# Patient Record
Sex: Male | Born: 1975 | Race: White | Hispanic: Yes | Marital: Married | Smoking: Never smoker
Health system: Southern US, Community
[De-identification: ages and names within clinical notes are randomized; demographics above are authoritative.]

## PROBLEM LIST (undated history)

## (undated) DIAGNOSIS — I82409 Acute embolism and thrombosis of unspecified deep veins of unspecified lower extremity: Secondary | ICD-10-CM

## (undated) DIAGNOSIS — U071 COVID-19: Secondary | ICD-10-CM

## (undated) DIAGNOSIS — I2699 Other pulmonary embolism without acute cor pulmonale: Secondary | ICD-10-CM

---

## 2004-12-29 ENCOUNTER — Ambulatory Visit: Payer: Self-pay | Admitting: *Deleted

## 2008-07-26 ENCOUNTER — Emergency Department (HOSPITAL_COMMUNITY): Admission: EM | Admit: 2008-07-26 | Discharge: 2008-07-26 | Payer: Self-pay | Admitting: Emergency Medicine

## 2010-10-16 LAB — URINALYSIS, ROUTINE W REFLEX MICROSCOPIC
Bilirubin Urine: NEGATIVE
Glucose, UA: NEGATIVE mg/dL
Hgb urine dipstick: NEGATIVE
Ketones, ur: NEGATIVE mg/dL
Nitrite: NEGATIVE
Protein, ur: NEGATIVE mg/dL
Specific Gravity, Urine: 1.015 (ref 1.005–1.030)
Urobilinogen, UA: 0.2 mg/dL (ref 0.0–1.0)
pH: 6.5 (ref 5.0–8.0)

## 2010-10-16 LAB — GC/CHLAMYDIA PROBE AMP, GENITAL
Chlamydia, DNA Probe: NEGATIVE
GC Probe Amp, Genital: NEGATIVE

## 2020-02-03 ENCOUNTER — Inpatient Hospital Stay (HOSPITAL_BASED_OUTPATIENT_CLINIC_OR_DEPARTMENT_OTHER)
Admission: EM | Admit: 2020-02-03 | Discharge: 2020-02-10 | DRG: 177 | Disposition: A | Discharge status: Home / Self Care | Payer: HRSA Program | Attending: Internal Medicine | Admitting: Internal Medicine

## 2020-02-03 ENCOUNTER — Encounter (HOSPITAL_BASED_OUTPATIENT_CLINIC_OR_DEPARTMENT_OTHER): Payer: Self-pay | Admitting: Emergency Medicine

## 2020-02-03 ENCOUNTER — Emergency Department (HOSPITAL_BASED_OUTPATIENT_CLINIC_OR_DEPARTMENT_OTHER): Payer: HRSA Program

## 2020-02-03 ENCOUNTER — Other Ambulatory Visit: Payer: Self-pay

## 2020-02-03 DIAGNOSIS — D649 Anemia, unspecified: Secondary | ICD-10-CM | POA: Diagnosis present

## 2020-02-03 DIAGNOSIS — J1282 Pneumonia due to coronavirus disease 2019: Secondary | ICD-10-CM | POA: Diagnosis present

## 2020-02-03 DIAGNOSIS — T380X5A Adverse effect of glucocorticoids and synthetic analogues, initial encounter: Secondary | ICD-10-CM | POA: Diagnosis present

## 2020-02-03 DIAGNOSIS — U071 COVID-19: Secondary | ICD-10-CM | POA: Diagnosis not present

## 2020-02-03 DIAGNOSIS — R7401 Elevation of levels of liver transaminase levels: Secondary | ICD-10-CM | POA: Diagnosis present

## 2020-02-03 DIAGNOSIS — J9601 Acute respiratory failure with hypoxia: Secondary | ICD-10-CM | POA: Diagnosis present

## 2020-02-03 DIAGNOSIS — R739 Hyperglycemia, unspecified: Secondary | ICD-10-CM | POA: Diagnosis present

## 2020-02-03 DIAGNOSIS — R7303 Prediabetes: Secondary | ICD-10-CM | POA: Diagnosis present

## 2020-02-03 DIAGNOSIS — R197 Diarrhea, unspecified: Secondary | ICD-10-CM | POA: Diagnosis present

## 2020-02-03 LAB — D-DIMER, QUANTITATIVE: D-Dimer, Quant: 0.54 ug/mL-FEU — ABNORMAL HIGH (ref 0.00–0.50)

## 2020-02-03 LAB — SARS CORONAVIRUS 2 BY RT PCR (HOSPITAL ORDER, PERFORMED IN ~~LOC~~ HOSPITAL LAB): SARS Coronavirus 2: POSITIVE — AB

## 2020-02-03 LAB — CBC WITH DIFFERENTIAL/PLATELET
Abs Immature Granulocytes: 0.06 10*3/uL (ref 0.00–0.07)
Basophils Absolute: 0 10*3/uL (ref 0.0–0.1)
Basophils Relative: 0 %
Eosinophils Absolute: 0 10*3/uL (ref 0.0–0.5)
Eosinophils Relative: 0 %
HCT: 38.9 % — ABNORMAL LOW (ref 39.0–52.0)
Hemoglobin: 12.8 g/dL — ABNORMAL LOW (ref 13.0–17.0)
Immature Granulocytes: 1 %
Lymphocytes Relative: 13 %
Lymphs Abs: 1 10*3/uL (ref 0.7–4.0)
MCH: 29.1 pg (ref 26.0–34.0)
MCHC: 32.9 g/dL (ref 30.0–36.0)
MCV: 88.4 fL (ref 80.0–100.0)
Monocytes Absolute: 0.6 10*3/uL (ref 0.1–1.0)
Monocytes Relative: 8 %
Neutro Abs: 5.8 10*3/uL (ref 1.7–7.7)
Neutrophils Relative %: 78 %
Platelets: 253 10*3/uL (ref 150–400)
RBC: 4.4 MIL/uL (ref 4.22–5.81)
RDW: 13.4 % (ref 11.5–15.5)
WBC: 7.5 10*3/uL (ref 4.0–10.5)
nRBC: 0 % (ref 0.0–0.2)

## 2020-02-03 LAB — PROCALCITONIN: Procalcitonin: <0.1 ng/mL

## 2020-02-03 LAB — COMPREHENSIVE METABOLIC PANEL
ALT: 55 U/L — ABNORMAL HIGH (ref 0–44)
AST: 51 U/L — ABNORMAL HIGH (ref 15–41)
Albumin: 3.1 g/dL — ABNORMAL LOW (ref 3.5–5.0)
Alkaline Phosphatase: 188 U/L — ABNORMAL HIGH (ref 38–126)
Anion gap: 12 (ref 5–15)
BUN: 7 mg/dL (ref 6–20)
CO2: 27 mmol/L (ref 22–32)
Calcium: 7.8 mg/dL — ABNORMAL LOW (ref 8.9–10.3)
Chloride: 97 mmol/L — ABNORMAL LOW (ref 98–111)
Creatinine, Ser: 0.83 mg/dL (ref 0.61–1.24)
GFR calc Af Amer: >60 mL/min (ref >60)
GFR calc non Af Amer: >60 mL/min (ref >60)
Glucose, Bld: 158 mg/dL — ABNORMAL HIGH (ref 70–99)
Potassium: 4.4 mmol/L (ref 3.5–5.1)
Sodium: 136 mmol/L (ref 135–145)
Total Bilirubin: 0.4 mg/dL (ref 0.3–1.2)
Total Protein: 7.6 g/dL (ref 6.5–8.1)

## 2020-02-03 LAB — LACTATE DEHYDROGENASE: LDH: 256 U/L — ABNORMAL HIGH (ref 98–192)

## 2020-02-03 LAB — FIBRINOGEN: Fibrinogen: 684 mg/dL — ABNORMAL HIGH (ref 210–475)

## 2020-02-03 LAB — FERRITIN: Ferritin: 530 ng/mL — ABNORMAL HIGH (ref 24–336)

## 2020-02-03 LAB — LACTIC ACID, PLASMA: Lactic Acid, Venous: 1.2 mmol/L (ref 0.5–1.9)

## 2020-02-03 LAB — C-REACTIVE PROTEIN: CRP: 18.8 mg/dL — ABNORMAL HIGH (ref <1.0)

## 2020-02-03 LAB — TRIGLYCERIDES: Triglycerides: 159 mg/dL — ABNORMAL HIGH (ref <150)

## 2020-02-03 MED ORDER — ACETAMINOPHEN 325 MG PO TABS
650.0000 mg | ORAL_TABLET | Freq: Once | ORAL | Status: AC
  Administered 2020-02-03: 650 mg via ORAL
  Filled 2020-02-03: qty 2

## 2020-02-03 MED ORDER — DEXAMETHASONE SODIUM PHOSPHATE 10 MG/ML IJ SOLN
10.0000 mg | Freq: Once | INTRAMUSCULAR | Status: AC
  Administered 2020-02-03: 10 mg via INTRAVENOUS
  Filled 2020-02-03: qty 1

## 2020-02-03 MED ORDER — SODIUM CHLORIDE 0.9 % IV SOLN
100.0000 mg | INTRAVENOUS | Status: AC
  Administered 2020-02-03 (×2): 100 mg via INTRAVENOUS
  Filled 2020-02-03 (×2): qty 20

## 2020-02-03 MED ORDER — ACETAMINOPHEN 500 MG PO TABS
ORAL_TABLET | ORAL | Status: AC
  Filled 2020-02-03: qty 1

## 2020-02-03 MED ORDER — SODIUM CHLORIDE 0.9 % IV SOLN
100.0000 mg | Freq: Every day | INTRAVENOUS | Status: AC
  Administered 2020-02-04 – 2020-02-07 (×4): 100 mg via INTRAVENOUS
  Filled 2020-02-03 (×4): qty 20

## 2020-02-03 NOTE — ED Notes (Signed)
RT assessed upon entering room. Patient was up walking room, RA SAT 84%. Noted some SOB. Placed on 4L [place], SAT now 96%. BBS diminished. Stated he has been sicl for 11 days. Patient place on monitor.

## 2020-02-03 NOTE — ED Triage Notes (Signed)
Sob x 11 days, fever, low sats in triage, possible covid exposure.

## 2020-02-03 NOTE — ED Notes (Signed)
Wife updated @ [phone]

## 2020-02-03 NOTE — ED Provider Notes (Signed)
[facility] EMERGENCY DEPARTMENT Provider Note  CSN: [id number] Arrival date & time: 02/03/20 1103    History Chief Complaint  Patient presents with  . Shortness of Breath    HPI  Victor Rich is a 44 y.o. male with no significant PMH reports he has been feeling poorly the last 11 days. Started with fevers and body aches, progressed to SOB a few days ago, worse today. Some dry cough, headache and diarrhea. Has not had covid vaccine.    No past medical history on file.    No family history on file.  Social History   Tobacco Use  . Smoking status: Never Smoker  . Smokeless tobacco: Never Used  Substance Use Topics  . Alcohol use: Not on file  . Drug use: Not on file     Home Medications Prior to Admission medications   Not on File     Allergies    Patient has no known allergies.   Review of Systems   Review of Systems A comprehensive review of systems was completed and negative except as noted in HPI.    Physical Exam BP 96/69   Pulse 71   Temp 98.7 F (37.1 C) (Oral)   Resp (!) 22   Ht 5\' 6"  (1.676 m)   Wt 79.4 kg   SpO2 97%   BMI 28.25 kg/m   Physical Exam Vitals and nursing note reviewed.  Constitutional:      Appearance: Normal appearance.  HENT:     Head: Normocephalic and atraumatic.     Nose: Nose normal.     Mouth/Throat:     Mouth: Mucous membranes are moist.  Eyes:     Extraocular Movements: Extraocular movements intact.     Conjunctiva/sclera: Conjunctivae normal.  Cardiovascular:     Rate and Rhythm: Normal rate.  Pulmonary:     Effort: Pulmonary effort is normal.     Breath sounds: Normal breath sounds.  Abdominal:     General: Abdomen is flat.     Palpations: Abdomen is soft.     Tenderness: There is no abdominal tenderness.  Musculoskeletal:        General: No swelling. Normal range of motion.     Cervical back: Neck supple.  Skin:    General: Skin is warm and dry.  Neurological:     General: No focal  deficit present.     Mental Status: He is alert.  Psychiatric:        Mood and Affect: Mood normal.      ED Results / Procedures / Treatments   Labs (all labs ordered are listed, but only abnormal results are displayed) Labs Reviewed  SARS CORONAVIRUS 2 BY RT PCR (HOSPITAL ORDER, PERFORMED IN [facility]) - Abnormal; Notable for the following components:      Result Value   SARS Coronavirus 2 POSITIVE (*)    All other components within normal limits  CBC WITH DIFFERENTIAL/PLATELET - Abnormal; Notable for the following components:   Hemoglobin 12.8 (*)    HCT 38.9 (*)    All other components within normal limits  COMPREHENSIVE METABOLIC PANEL - Abnormal; Notable for the following components:   Chloride 97 (*)    Glucose, Bld 158 (*)    Calcium 7.8 (*)    Albumin 3.1 (*)    AST 51 (*)    ALT 55 (*)    Alkaline Phosphatase 188 (*)    All other components within normal limits  D-DIMER,  QUANTITATIVE (NOT AT [facility]) - Abnormal; Notable for the following components:   D-Dimer, Quant 0.54 (*)    All other components within normal limits  CULTURE, BLOOD (ROUTINE X 2)  CULTURE, BLOOD (ROUTINE X 2)  LACTIC ACID, PLASMA  LACTIC ACID, PLASMA  PROCALCITONIN  LACTATE DEHYDROGENASE  FERRITIN  TRIGLYCERIDES  FIBRINOGEN  C-REACTIVE PROTEIN    EKG EKG Interpretation  Date/Time:  Wednesday February 03 2020 12:01:42 EDT Ventricular Rate:  82 PR Interval:    QRS Duration: 86 QT Interval:  373 QTC Calculation: 436 R Axis:   11 Text Interpretation: Sinus rhythm Borderline ST elevation, lateral leads No old tracing to compare Confirmed by Susy Frizzle [phone]) on 02/03/2020 12:32:29 PM    Radiology DG Chest Port 1 View  Result Date: 02/03/2020 CLINICAL DATA:  Cough, shortness of breath, fever EXAM: PORTABLE CHEST 1 VIEW COMPARISON:  None. FINDINGS: The heart size and mediastinal contours are within normal limits. Low lung volumes. Hazy interstitial opacities within the  peripheral aspects of the bilateral lung bases, left slightly worse than right. No appreciable pleural fluid collection. No pneumothorax. The visualized skeletal structures are unremarkable. IMPRESSION: Hazy interstitial opacities within the peripheral aspects of the bilateral lung bases, left slightly worse than right. Findings suspicious for atypical/viral infection. Electronically Signed   By: Duanne Guess D.O.   On: 02/03/2020 12:26    Procedures Procedures  Medications Ordered in the ED Medications  dexamethasone (DECADRON) injection 10 mg (has no administration in time range)  remdesivir 100 mg in sodium chloride 0.9 % 100 mL IVPB (has no administration in time range)  remdesivir 100 mg in sodium chloride 0.9 % 100 mL IVPB (has no administration in time range)     MDM Rules/Calculators/A&P MDM Patient noted to be hypoxic on RA on arrival, improved with 4L . Suspect he has Covid, will initiate Covid order set for likely admission.  ED Course  I have reviewed the triage vital signs and the nursing notes.  Pertinent labs & imaging results that were available during my care of the patient were reviewed by me and considered in my medical decision making (see chart for details).  Clinical Course as of Feb 02 1358  Wed Feb 03, 2020  1232 CXR consistent with suspected covid.    [CS]  1245 CBC and lactic acid are normal.    [CS]  1259 Mild elevated glucose and LFTs on CMP, otherwise unremarkable. Dimer mildly elevated, consist with suspected Covid.   [CS]  1347 Covid confirmed positive. Will discuss with Hospitalist for admission.    [CS]  1353 Spoke with Dr. Osvaldo Shipper, Hospitalist, who will admit. Remdesivir and Decadron initiated. Patient aware of plan.    [CS]    Clinical Course User Index [CS] Pollyann Savoy, MD    Final Clinical Impression(s) / ED Diagnoses Final diagnoses:  None    Rx / DC Orders ED Discharge Orders    None       Pollyann Savoy,  MD 02/03/20 1359

## 2020-02-04 ENCOUNTER — Inpatient Hospital Stay (HOSPITAL_COMMUNITY): Payer: HRSA Program

## 2020-02-04 DIAGNOSIS — R9431 Abnormal electrocardiogram [ECG] [EKG]: Secondary | ICD-10-CM

## 2020-02-04 DIAGNOSIS — R7401 Elevation of levels of liver transaminase levels: Secondary | ICD-10-CM | POA: Diagnosis not present

## 2020-02-04 DIAGNOSIS — T380X5A Adverse effect of glucocorticoids and synthetic analogues, initial encounter: Secondary | ICD-10-CM | POA: Diagnosis present

## 2020-02-04 DIAGNOSIS — R739 Hyperglycemia, unspecified: Secondary | ICD-10-CM | POA: Diagnosis present

## 2020-02-04 DIAGNOSIS — R197 Diarrhea, unspecified: Secondary | ICD-10-CM | POA: Diagnosis present

## 2020-02-04 DIAGNOSIS — D649 Anemia, unspecified: Secondary | ICD-10-CM | POA: Diagnosis present

## 2020-02-04 DIAGNOSIS — R7303 Prediabetes: Secondary | ICD-10-CM | POA: Diagnosis present

## 2020-02-04 DIAGNOSIS — U071 COVID-19: Secondary | ICD-10-CM | POA: Diagnosis present

## 2020-02-04 DIAGNOSIS — J9601 Acute respiratory failure with hypoxia: Secondary | ICD-10-CM | POA: Diagnosis not present

## 2020-02-04 DIAGNOSIS — J1282 Pneumonia due to coronavirus disease 2019: Secondary | ICD-10-CM | POA: Diagnosis present

## 2020-02-04 LAB — MRSA PCR SCREENING: MRSA by PCR: NEGATIVE

## 2020-02-04 LAB — COMPREHENSIVE METABOLIC PANEL
ALT: 57 U/L — ABNORMAL HIGH (ref 0–44)
AST: 52 U/L — ABNORMAL HIGH (ref 15–41)
Albumin: 3.4 g/dL — ABNORMAL LOW (ref 3.5–5.0)
Alkaline Phosphatase: 192 U/L — ABNORMAL HIGH (ref 38–126)
Anion gap: 8 (ref 5–15)
BUN: 16 mg/dL (ref 6–20)
CO2: 27 mmol/L (ref 22–32)
Calcium: 8.3 mg/dL — ABNORMAL LOW (ref 8.9–10.3)
Chloride: 101 mmol/L (ref 98–111)
Creatinine, Ser: 0.76 mg/dL (ref 0.61–1.24)
GFR calc Af Amer: >60 mL/min (ref >60)
GFR calc non Af Amer: >60 mL/min (ref >60)
Glucose, Bld: 164 mg/dL — ABNORMAL HIGH (ref 70–99)
Potassium: 4.1 mmol/L (ref 3.5–5.1)
Sodium: 136 mmol/L (ref 135–145)
Total Bilirubin: 0.4 mg/dL (ref 0.3–1.2)
Total Protein: 7.1 g/dL (ref 6.5–8.1)

## 2020-02-04 LAB — CBC WITH DIFFERENTIAL/PLATELET
Abs Immature Granulocytes: 0.07 10*3/uL (ref 0.00–0.07)
Basophils Absolute: 0 10*3/uL (ref 0.0–0.1)
Basophils Relative: 0 %
Eosinophils Absolute: 0 10*3/uL (ref 0.0–0.5)
Eosinophils Relative: 0 %
HCT: 38.5 % — ABNORMAL LOW (ref 39.0–52.0)
Hemoglobin: 12.9 g/dL — ABNORMAL LOW (ref 13.0–17.0)
Immature Granulocytes: 1 %
Lymphocytes Relative: 15 %
Lymphs Abs: 0.9 10*3/uL (ref 0.7–4.0)
MCH: 29.3 pg (ref 26.0–34.0)
MCHC: 33.5 g/dL (ref 30.0–36.0)
MCV: 87.3 fL (ref 80.0–100.0)
Monocytes Absolute: 0.4 10*3/uL (ref 0.1–1.0)
Monocytes Relative: 7 %
Neutro Abs: 4.7 10*3/uL (ref 1.7–7.7)
Neutrophils Relative %: 77 %
Platelets: 323 10*3/uL (ref 150–400)
RBC: 4.41 MIL/uL (ref 4.22–5.81)
RDW: 13.3 % (ref 11.5–15.5)
WBC: 6.1 10*3/uL (ref 4.0–10.5)
nRBC: 0 % (ref 0.0–0.2)

## 2020-02-04 LAB — D-DIMER, QUANTITATIVE: D-Dimer, Quant: 1.01 ug/mL-FEU — ABNORMAL HIGH (ref 0.00–0.50)

## 2020-02-04 LAB — C-REACTIVE PROTEIN: CRP: 16.7 mg/dL — ABNORMAL HIGH (ref <1.0)

## 2020-02-04 MED ORDER — ENOXAPARIN SODIUM 40 MG/0.4ML ~~LOC~~ SOLN
40.0000 mg | SUBCUTANEOUS | Status: DC
  Administered 2020-02-04 – 2020-02-09 (×6): 40 mg via SUBCUTANEOUS
  Filled 2020-02-04 (×6): qty 0.4

## 2020-02-04 MED ORDER — SODIUM CHLORIDE (PF) 0.9 % IJ SOLN
INTRAMUSCULAR | Status: AC
  Filled 2020-02-04: qty 50

## 2020-02-04 MED ORDER — ACETAMINOPHEN 325 MG PO TABS
650.0000 mg | ORAL_TABLET | Freq: Four times a day (QID) | ORAL | Status: DC | PRN
  Administered 2020-02-05: 650 mg via ORAL
  Filled 2020-02-04: qty 2

## 2020-02-04 MED ORDER — ONDANSETRON HCL 4 MG PO TABS: 4.0000 mg | ORAL_TABLET | Freq: Four times a day (QID) | ORAL | Status: DC | PRN

## 2020-02-04 MED ORDER — SODIUM CHLORIDE 0.9 % IV SOLN: 250.0000 mL | INTRAVENOUS | Status: DC | PRN

## 2020-02-04 MED ORDER — HYDROCOD POLST-CPM POLST ER 10-8 MG/5ML PO SUER: 5.0000 mL | Freq: Two times a day (BID) | ORAL | Status: DC | PRN

## 2020-02-04 MED ORDER — CHLORHEXIDINE GLUCONATE CLOTH 2 % EX PADS
6.0000 | MEDICATED_PAD | Freq: Every day | CUTANEOUS | Status: DC
  Administered 2020-02-04 – 2020-02-10 (×6): 6 via TOPICAL

## 2020-02-04 MED ORDER — IOHEXOL 350 MG/ML SOLN
100.0000 mL | Freq: Once | INTRAVENOUS | Status: AC | PRN
  Administered 2020-02-04: 100 mL via INTRAVENOUS

## 2020-02-04 MED ORDER — METHYLPREDNISOLONE SODIUM SUCC 40 MG IJ SOLR
40.0000 mg | Freq: Two times a day (BID) | INTRAMUSCULAR | Status: DC
  Administered 2020-02-04 (×2): 40 mg via INTRAVENOUS
  Filled 2020-02-04 (×2): qty 1

## 2020-02-04 MED ORDER — SODIUM CHLORIDE 0.9% FLUSH
3.0000 mL | Freq: Two times a day (BID) | INTRAVENOUS | Status: DC
  Administered 2020-02-04: 10 mL via INTRAVENOUS
  Administered 2020-02-04 – 2020-02-10 (×12): 3 mL via INTRAVENOUS

## 2020-02-04 MED ORDER — ACETAMINOPHEN 325 MG PO TABS
650.0000 mg | ORAL_TABLET | Freq: Once | ORAL | Status: AC
  Administered 2020-02-04: 650 mg via ORAL
  Filled 2020-02-04: qty 2

## 2020-02-04 MED ORDER — TOCILIZUMAB 400 MG/20ML IV SOLN
600.0000 mg | Freq: Once | INTRAVENOUS | Status: AC
  Administered 2020-02-04: 600 mg via INTRAVENOUS
  Filled 2020-02-04: qty 20

## 2020-02-04 MED ORDER — ASCORBIC ACID 500 MG PO TABS
500.0000 mg | ORAL_TABLET | Freq: Every day | ORAL | Status: DC
  Administered 2020-02-04 – 2020-02-10 (×7): 500 mg via ORAL
  Filled 2020-02-04 (×7): qty 1

## 2020-02-04 MED ORDER — ZINC SULFATE 220 (50 ZN) MG PO CAPS
220.0000 mg | ORAL_CAPSULE | Freq: Every day | ORAL | Status: DC
  Administered 2020-02-04 – 2020-02-10 (×7): 220 mg via ORAL
  Filled 2020-02-04 (×7): qty 1

## 2020-02-04 MED ORDER — FUROSEMIDE 10 MG/ML IJ SOLN
40.0000 mg | Freq: Once | INTRAMUSCULAR | Status: AC
  Administered 2020-02-04: 40 mg via INTRAVENOUS
  Filled 2020-02-04: qty 4

## 2020-02-04 MED ORDER — FAMOTIDINE 20 MG PO TABS
20.0000 mg | ORAL_TABLET | Freq: Every day | ORAL | Status: DC
  Administered 2020-02-04 – 2020-02-10 (×7): 20 mg via ORAL
  Filled 2020-02-04 (×7): qty 1

## 2020-02-04 MED ORDER — SODIUM CHLORIDE 0.9% FLUSH: 3.0000 mL | INTRAVENOUS | Status: DC | PRN

## 2020-02-04 MED ORDER — ONDANSETRON HCL 4 MG/2ML IJ SOLN: 4.0000 mg | Freq: Four times a day (QID) | INTRAMUSCULAR | Status: DC | PRN

## 2020-02-04 MED ORDER — GUAIFENESIN-DM 100-10 MG/5ML PO SYRP
10.0000 mL | ORAL_SOLUTION | ORAL | Status: DC | PRN
  Administered 2020-02-06: 10 mL via ORAL
  Filled 2020-02-04: qty 10

## 2020-02-04 NOTE — ED Provider Notes (Signed)
Patient is boarding in the emergency department, awaiting an inpatient bed.  I discussed with Dr. Rito Ehrlich, who asked for CBC, CMP, CRP, and D-dimer. Solumedrol 40 mg BID   Pricilla Loveless, MD 02/04/20 [phone]

## 2020-02-04 NOTE — H&P (Addendum)
[facility] History and Physical  Rose Hippler FYB:[id number] DOB: 1976-06-29 DOA: 02/03/2020   PCP: Patient, No Pcp Per  Specialists: None  Chief Complaint: Shortness of breath  HPI: Victor Rich is a 44 y.o. male with no significant past medical history who presented to the emergency department yesterday after feeling poorly for about 10 to 12 days.  Has had fever body aches.  This progressed to shortness of breath.  Has had some dry cough.  Some nausea but no vomiting.  He is unvaccinated for COVID-19.  Patient mentions that he has chest pain when he takes a deep breath but not otherwise.  Reports loose stool this morning but none since then.  Denies any history of liver disease, tuberculosis, bowel perforation.  In the emergency department he was found to have elevated inflammatory markers and chest x-ray concerning for pneumonia.  He was requiring about 4 L of oxygen but here in the stepdown unit he is noted to be requiring more.  Home Medications: Prior to Admission medications   Not on File    Allergies: No Known Allergies  Past Medical History: Denies any significant past medical history  Social History: Lives smoking alcohol use illicit drug use.  Lives with his family.  Family History:  Denies any health problems in his family  Review of Systems - History obtained from the patient General ROS: positive for  - fatigue Psychological ROS: positive for - anxiety Ophthalmic ROS: negative ENT ROS: negative Allergy and Immunology ROS: negative Hematological and Lymphatic ROS: negative Endocrine ROS: negative Respiratory ROS: As in HPI Cardiovascular ROS: As in HPI Gastrointestinal ROS: no abdominal pain, change in bowel habits, or black or bloody stools Genito-Urinary ROS: no dysuria, trouble voiding, or hematuria Musculoskeletal ROS: negative Neurological ROS: no TIA or stroke symptoms Dermatological ROS: negative  Physical Examination  Vitals:   02/04/20  1200 02/04/20 1230 02/04/20 1330 02/04/20 1400  BP: 103/72 101/69  118/77  Pulse: 75 77  81  Resp: (!) 22 (!) 26  (!) 27  Temp:      TempSrc:      SpO2: 91% (!) 89%  (!) 87%  Weight:   73.5 kg   Height:   5\' 7"  (1.702 m)     BP 118/77   Pulse 81   Temp 98.7 F (37.1 C) (Oral)   Resp (!) 27   Ht 5\' 7"  (1.702 m)   Wt 73.5 kg   SpO2 (!) 87%   BMI 25.38 kg/m   General appearance: alert, cooperative, appears stated age and no distress Head: Normocephalic, without obvious abnormality, atraumatic Eyes: conjunctivae/corneas clear. PERRL, EOM's intact.  Throat: lips, mucosa, and tongue normal; teeth and gums normal Neck: no adenopathy, no carotid bruit, no JVD, supple, symmetrical, trachea midline and thyroid not enlarged, symmetric, no tenderness/mass/nodules Resp: Normal effort at rest.  Diminished air entry bilateral bases.  No wheezing or rhonchi.  Few crackles. Cardio: regular rate and rhythm, S1, S2 normal, no murmur, click, rub or gallop GI: soft, non-tender; bowel sounds normal; no masses,  no organomegaly Extremities: extremities normal, atraumatic, no cyanosis or edema Pulses: 2+ and symmetric Skin: Skin color, texture, turgor normal. No rashes or lesions Lymph nodes: Cervical, supraclavicular, and axillary nodes normal. Neurologic: Alert and oriented x3.  No focal neurological deficits.   Labs on Admission: I have personally reviewed following labs and imaging studies  CBC: Recent Labs  Lab 02/03/20 1202 02/04/20 0919  WBC 7.5 6.1  NEUTROABS 5.8 4.7  HGB  12.8* 12.9*  HCT 38.9* 38.5*  MCV 88.4 87.3  PLT 253 323   Basic Metabolic Panel: Recent Labs  Lab 02/03/20 1202 02/04/20 0919  NA 136 136  K 4.4 4.1  CL 97* 101  CO2 27 27  GLUCOSE 158* 164*  BUN 7 16  CREATININE 0.83 0.76  CALCIUM 7.8* 8.3*   GFR: Estimated Creatinine Clearance: 113.6 mL/min (by C-G formula based on SCr of 0.76 mg/dL). Liver Function Tests: Recent Labs  Lab 02/03/20 1202  02/04/20 0919  AST 51* 52*  ALT 55* 57*  ALKPHOS 188* 192*  BILITOT 0.4 0.4  PROT 7.6 7.1  ALBUMIN 3.1* 3.4*   Lipid Profile: Recent Labs    02/03/20 1202  TRIG 159*   Anemia Panel: Recent Labs    02/03/20 1202  FERRITIN 530*     Radiological Exams on Admission: DG CHEST PORT 1 VIEW  Result Date: 02/04/2020 CLINICAL DATA:  Eval for infection. EXAM: PORTABLE CHEST 1 VIEW COMPARISON:  02/03/2020 chest radiograph. FINDINGS: Hypoinflated lungs. Bilateral perihilar and bibasilar patchy opacities are increased. No pneumothorax. Trace left pleural effusion, unchanged. Cardiomediastinal silhouette is partially obscured. Osseous structures are unchanged. IMPRESSION: Mildly increased bilateral perihilar/bibasilar opacities are concerning for infection. Trace left pleural effusion, unchanged. Electronically Signed   By: Stana Bunting M.D.   On: 02/04/2020 15:05   DG Chest Port 1 View  Result Date: 02/03/2020 CLINICAL DATA:  Cough, shortness of breath, fever EXAM: PORTABLE CHEST 1 VIEW COMPARISON:  None. FINDINGS: The heart size and mediastinal contours are within normal limits. Low lung volumes. Hazy interstitial opacities within the peripheral aspects of the bilateral lung bases, left slightly worse than right. No appreciable pleural fluid collection. No pneumothorax. The visualized skeletal structures are unremarkable. IMPRESSION: Hazy interstitial opacities within the peripheral aspects of the bilateral lung bases, left slightly worse than right. Findings suspicious for atypical/viral infection. Electronically Signed   By: Duanne Guess D.O.   On: 02/03/2020 12:26    My interpretation of Electrocardiogram: Sinus rhythm in the 80s.  Normal axis.  Nonspecific T wave and ST changes noted.  No old or EKG available for comparison.   Problem List  Principal Problem:   Pneumonia due to COVID-19 virus Active Problems:   Acute respiratory failure with hypoxia (HCC)   Assessment:  This is a 44 year old Hispanic male who does not have any significant past medical history comes in with shortness of breath.  He was found to be positive for COVID-19.  Noted to be hypoxic.  Plan:  Acute Hypoxic Resp. Failure/Pneumonia due to COVID-19  Recent Labs  Lab 02/03/20 1202 02/04/20 0919  DDIMER 0.54* 1.01*  FERRITIN 530*  --   CRP 18.8* 16.7*  ALT 55* 57*  PROCALCITON <0.10  --     Objective findings: Fever: Noted to be afebrile Oxygen requirements: Requiring about 7 L of oxygen by high flow nasal cannula.  COVID 19 Therapeutics: Antibacterials: None.  Procalcitonin was less than 0.1. Remdesivir: Day 2 today Steroids: Solu-Medrol 40 mg every 12 hours Diuretics: Lasix x1 today Actemra: Will decide on Actemra depending on x-ray findings PUD Prophylaxis: Start Pepcid DVT Prophylaxis:  Lovenox   Patient's chest x-ray showed opacities in the periphery of the lungs.  Patient's oxygen requirement seems to have gone up in the last few hours.  Chest x-ray to be repeated.  Inflammatory markers are elevated.  Patient is already receiving remdesivir and steroids.  May need to consider Actemra.  He has only minimal elevation in  his LFTs.  No history of tuberculosis or bowel issues.  He is agreeable to the same if needed.  Elevated D-dimer noted and could be due to COVID-19.  Repeat chest x-ray only shows mild increase in the opacity.  The findings are somewhat underwhelming considering the degree of his hypoxia.  At this time we will pursue CT angiogram of his chest to rule out thromboembolic process.  CT angiogram negative for PE.  Did show dense airspace consolidations with air bronchograms within the medial aspect of bilateral lung bases with additional areas of opacity seen throughout both lung fields.  Incidental cholelithiasis was noted.    Patient currently on 10 L of oxygen.  Minimal LFT elevation noted.  At this time we will proceed with Actemra.  This was previously  discussed with the patient.   Patient encouraged to stay in prone position as much as possible.  Incentive spirometer.  Mobilization.  Continue to trend inflammatory markers.  Mild transaminitis Possibly due to acute viral infection.  Continue to monitor.  Check hepatitis panel.  Hyperglycemia Check a fasting level.  Check HbA1c.  Elevated glucose level today could be due to steroids that he received yesterday.  Normocytic anemia No evidence of overt bleeding.  Continue to monitor.  Abnormal EKG Nonspecific changes noted.  Patient denies any chest pain.  Do echocardiogram to rule out cardiac involvement due to COVID-19.  DVT Prophylaxis: Lovenox Code Status: Full code Family Communication: Discussed with the patient.  Will call his wife Disposition: Hopefully return home in improved Consults called: None Admission Status: Status is: Inpatient  Remains inpatient appropriate because:IV treatments appropriate due to intensity of illness or inability to take PO and Inpatient level of care appropriate due to severity of illness   Dispo: The patient is from: Home              Anticipated d/c is to: Home              Anticipated d/c date is: 2 days              Patient currently is not medically stable to d/c.    Severity of Illness: The appropriate patient status for this patient is INPATIENT. Inpatient status is judged to be reasonable and necessary in order to provide the required intensity of service to ensure the patient's safety. The patient's presenting symptoms, physical exam findings, and initial radiographic and laboratory data in the context of their chronic comorbidities is felt to place them at high risk for further clinical deterioration. Furthermore, it is not anticipated that the patient will be medically stable for discharge from the hospital within 2 midnights of admission. The following factors support the patient status of inpatient.   " The patient's presenting  symptoms include shortness of breath. " The worrisome physical exam findings include hypoxia. " The initial radiographic and laboratory data are worrisome because of pneumonia due to COVID-19. " The chronic co-morbidities include none.   * I certify that at the point of admission it is my clinical judgment that the patient will require inpatient hospital care spanning beyond 2 midnights from the point of admission due to high intensity of service, high risk for further deterioration and high frequency of surveillance required.*  Further management decisions will depend on results of further testing and patient's response to treatment.   Areona Homer [facility]  [facility] [employer] on [email or web address]  02/04/2020, 3:08 PM

## 2020-02-05 ENCOUNTER — Inpatient Hospital Stay (HOSPITAL_COMMUNITY): Payer: HRSA Program

## 2020-02-05 DIAGNOSIS — R9431 Abnormal electrocardiogram [ECG] [EKG]: Secondary | ICD-10-CM

## 2020-02-05 LAB — COMPREHENSIVE METABOLIC PANEL
ALT: 64 U/L — ABNORMAL HIGH (ref 0–44)
AST: 49 U/L — ABNORMAL HIGH (ref 15–41)
Albumin: 3.1 g/dL — ABNORMAL LOW (ref 3.5–5.0)
Alkaline Phosphatase: 208 U/L — ABNORMAL HIGH (ref 38–126)
Anion gap: 9 (ref 5–15)
BUN: 24 mg/dL — ABNORMAL HIGH (ref 6–20)
CO2: 28 mmol/L (ref 22–32)
Calcium: 8 mg/dL — ABNORMAL LOW (ref 8.9–10.3)
Chloride: 101 mmol/L (ref 98–111)
Creatinine, Ser: 0.92 mg/dL (ref 0.61–1.24)
GFR calc Af Amer: >60 mL/min (ref >60)
GFR calc non Af Amer: >60 mL/min (ref >60)
Glucose, Bld: 182 mg/dL — ABNORMAL HIGH (ref 70–99)
Potassium: 4.8 mmol/L (ref 3.5–5.1)
Sodium: 138 mmol/L (ref 135–145)
Total Bilirubin: 0.8 mg/dL (ref 0.3–1.2)
Total Protein: 7.6 g/dL (ref 6.5–8.1)

## 2020-02-05 LAB — CBC WITH DIFFERENTIAL/PLATELET
Abs Immature Granulocytes: 0.16 10*3/uL — ABNORMAL HIGH (ref 0.00–0.07)
Basophils Absolute: 0 10*3/uL (ref 0.0–0.1)
Basophils Relative: 0 %
Eosinophils Absolute: 0 10*3/uL (ref 0.0–0.5)
Eosinophils Relative: 0 %
HCT: 41.9 % (ref 39.0–52.0)
Hemoglobin: 13.7 g/dL (ref 13.0–17.0)
Immature Granulocytes: 2 %
Lymphocytes Relative: 12 %
Lymphs Abs: 1.1 10*3/uL (ref 0.7–4.0)
MCH: 29.1 pg (ref 26.0–34.0)
MCHC: 32.7 g/dL (ref 30.0–36.0)
MCV: 89.1 fL (ref 80.0–100.0)
Monocytes Absolute: 0.6 10*3/uL (ref 0.1–1.0)
Monocytes Relative: 7 %
Neutro Abs: 7 10*3/uL (ref 1.7–7.7)
Neutrophils Relative %: 79 %
Platelets: 411 10*3/uL — ABNORMAL HIGH (ref 150–400)
RBC: 4.7 MIL/uL (ref 4.22–5.81)
RDW: 13.4 % (ref 11.5–15.5)
WBC: 8.8 10*3/uL (ref 4.0–10.5)
nRBC: 0.2 % (ref 0.0–0.2)

## 2020-02-05 LAB — GLUCOSE, CAPILLARY
Glucose-Capillary: 153 mg/dL — ABNORMAL HIGH (ref 70–99)
Glucose-Capillary: 149 mg/dL — ABNORMAL HIGH (ref 70–99)
Glucose-Capillary: 155 mg/dL — ABNORMAL HIGH (ref 70–99)
Glucose-Capillary: 173 mg/dL — ABNORMAL HIGH (ref 70–99)

## 2020-02-05 LAB — MAGNESIUM: Magnesium: 2.6 mg/dL — ABNORMAL HIGH (ref 1.7–2.4)

## 2020-02-05 LAB — ECHOCARDIOGRAM COMPLETE
Area-P 1/2: 3.19 cm2
Height: 67 in
S' Lateral: 3.05 cm
Weight: 2592.61 oz

## 2020-02-05 LAB — HIV ANTIBODY (ROUTINE TESTING W REFLEX): HIV Screen 4th Generation wRfx: NONREACTIVE

## 2020-02-05 LAB — D-DIMER, QUANTITATIVE: D-Dimer, Quant: 1.01 ug/mL-FEU — ABNORMAL HIGH (ref 0.00–0.50)

## 2020-02-05 LAB — HEMOGLOBIN A1C
Hgb A1c MFr Bld: 6.1 % — ABNORMAL HIGH (ref 4.8–5.6)
Mean Plasma Glucose: 128.37 mg/dL

## 2020-02-05 LAB — C-REACTIVE PROTEIN: CRP: 11.6 mg/dL — ABNORMAL HIGH (ref <1.0)

## 2020-02-05 MED ORDER — INSULIN ASPART 100 UNIT/ML ~~LOC~~ SOLN: 0.0000 [IU] | Freq: Every day | SUBCUTANEOUS | Status: DC

## 2020-02-05 MED ORDER — LINAGLIPTIN 5 MG PO TABS
5.0000 mg | ORAL_TABLET | Freq: Every day | ORAL | Status: DC
  Administered 2020-02-05 – 2020-02-10 (×6): 5 mg via ORAL
  Filled 2020-02-05 (×6): qty 1

## 2020-02-05 MED ORDER — METHYLPREDNISOLONE SODIUM SUCC 40 MG IJ SOLR
40.0000 mg | Freq: Three times a day (TID) | INTRAMUSCULAR | Status: DC
  Administered 2020-02-05 – 2020-02-10 (×17): 40 mg via INTRAVENOUS
  Filled 2020-02-05 (×18): qty 1

## 2020-02-05 MED ORDER — INSULIN ASPART 100 UNIT/ML ~~LOC~~ SOLN
0.0000 [IU] | Freq: Three times a day (TID) | SUBCUTANEOUS | Status: DC
  Administered 2020-02-05: 2 [IU] via SUBCUTANEOUS
  Administered 2020-02-05 (×2): 3 [IU] via SUBCUTANEOUS
  Administered 2020-02-06: 2 [IU] via SUBCUTANEOUS
  Administered 2020-02-06 – 2020-02-07 (×3): 3 [IU] via SUBCUTANEOUS
  Administered 2020-02-07: 2 [IU] via SUBCUTANEOUS
  Administered 2020-02-07: 3 [IU] via SUBCUTANEOUS
  Administered 2020-02-08 – 2020-02-09 (×4): 2 [IU] via SUBCUTANEOUS
  Administered 2020-02-09: 3 [IU] via SUBCUTANEOUS
  Administered 2020-02-09 – 2020-02-10 (×2): 2 [IU] via SUBCUTANEOUS

## 2020-02-05 NOTE — TOC Initial Note (Signed)
Transition of Care [facility]) - Initial/Assessment Note    Patient Details  Name: Victor Rich MRN: [id number] Date of Birth: 1976-02-07  Transition of Care [facility]) CM/SW Contact:    Golda Acre, RN Phone Number: 02/05/2020, 8:01 AM  Clinical Narrative:                 Unvaqccinated male with + testing for covid.  hfnc at 12l/min, iv remdesivir through [id number].  Expected Discharge Plan: Home/Self Care Barriers to Discharge: Continued Medical Work up   Patient Goals and CMS Choice Patient states their goals for this hospitalization and ongoing recovery are:: to go home CMS Medicare.gov Compare Post Acute Care list provided to:: Patient    Expected Discharge Plan and Services Expected Discharge Plan: Home/Self Care   Discharge Planning Services: CM Consult   Living arrangements for the past 2 months: Single Family Home                                      Prior Living Arrangements/Services Living arrangements for the past 2 months: Single Family Home Lives with:: Spouse Patient language and need for interpreter reviewed:: Yes Do you feel safe going back to the place where you live?: Yes      Need for Family Participation in Patient Care: Yes (Comment)     Criminal Activity/Legal Involvement Pertinent to Current Situation/Hospitalization: No - Comment as needed  Activities of Daily Living Home Assistive Devices/Equipment: None ADL Screening (condition at time of admission) Patient's cognitive ability adequate to safely complete daily activities?: Yes Is the patient deaf or have difficulty hearing?: No Does the patient have difficulty seeing, even when wearing glasses/contacts?: No Does the patient have difficulty concentrating, remembering, or making decisions?: No Patient able to express need for assistance with ADLs?: Yes Does the patient have difficulty dressing or bathing?: No Independently performs ADLs?: Yes (appropriate for developmental age) Does the  patient have difficulty walking or climbing stairs?: No Weakness of Legs: None Weakness of Arms/Hands: None  Permission Sought/Granted                  Emotional Assessment Appearance:: Appears stated age Attitude/Demeanor/Rapport: Engaged Affect (typically observed): Calm Orientation: : Oriented to Self, Oriented to Place, Oriented to  Time, Oriented to Situation Alcohol / Substance Use: Not Applicable Psych Involvement: No (comment)  Admission diagnosis:  Pneumonia due to COVID-19 virus [U07.1, J12.82] COVID-19 [U07.1] Patient Active Problem List   Diagnosis Date Noted  . Acute respiratory failure with hypoxia (HCC) 02/04/2020  . Pneumonia due to COVID-19 virus 02/03/2020   PCP:  Patient, No Pcp Per Pharmacy:   [facility] - [place], [place] - [address] [place] [address] Phone: [phone] Fax: [phone]     Social Determinants of Health (SDOH) Interventions    Readmission Risk Interventions No flowsheet data found.

## 2020-02-05 NOTE — Plan of Care (Signed)

## 2020-02-05 NOTE — Progress Notes (Signed)
  Echocardiogram 2D Echocardiogram has been performed.  Celene Skeen 02/05/2020, 2:40 PM

## 2020-02-05 NOTE — Progress Notes (Signed)
PROGRESS NOTE  Victor Rich  BWL:[id number] DOB: 10-04-1975 DOA: 02/03/2020 PCP: None per pt Brief Narrative: Victor Rich is a 44 y.o. male with no known medical history who presented to the ED 8/4 with shortness of breath that had worsened since 8/1, found to be hypoxemic requiring 4L O2 with positive SARS-CoV-2 PCR, elevated CRP at 18.8 with negative procalcitonin, WBC 6.1k, and CXR infiltrates consistent with covid-19 pneumonia. Remdesivir and steroids were started and he was admitted to [facility]. Hypoxemia worsened and CTA showed no PE but confirmed bilateral infiltrates. Tocilizumab was administered 8/5 and he remains on 12L HFNC.  Assessment & Plan: Principal Problem:   Pneumonia due to COVID-19 virus Active Problems:   Acute respiratory failure with hypoxia (HCC)  Acute hypoxemic respiratory failure due to covid-19 pneumonia: SARS-CoV-2 PCR positive on 8/4, unvaccinated.  - Continue remdesivir x5 days (8/4 - 8/8) - Continue steroids x10 days (8/4 - 8/13), will increase dose and cover CBGs as below.  - s/p tocilizumab 8/5.  - Encourage OOB, IS, FV, and awake proning if able - Continue airborne, contact precautions for 21 days from positive testing. - Monitor CMP and inflammatory markers - Enoxaparin prophylactic dose. No PE on recent CTA. DD ~1. - Discussed briefly with PCCM regarding the severity of infiltrates on my personal review of CTA and pt's recent worsening hypoxemia. Pt will be watched closely in SDU/ICU.   Steroid-induced hyperglycemia, prediabetes: - Carb-modified diet - Start linagliptin, moderate SSI  LFT elevation: Likely related to viral infection. - Monitor  Normocytic anemia:  - Monitoring  Lateral ST segment changes: No chest pain, no troponin elevation.  - Echocardiogram ordered.  DVT prophylaxis: Lovenox 40mg  q24h Code Status: Full Family Communication: None at bedside Disposition Plan:  Status is: Inpatient  Remains inpatient appropriate  because:Inpatient level of care appropriate due to severity of illness  Dispo: The patient is from: Home              Anticipated d/c is to: Home              Anticipated d/c date is: > 3 days              Patient currently is not medically stable to d/c.  Consultants:   None  Procedures:   None  Antimicrobials:  Remdesivir   Subjective: Shortness of breath improved significantly from admission. Mild at rest, moderate with exertion and associated with nonproductive cough and pleuritic left-sided chest pain. No leg swelling or orthopnea. Got sick around the same time as his sister and brother-in-law who is admitted with covid pneumonia as well.   Objective: Vitals:   02/05/20 0900 02/05/20 1000 02/05/20 1200 02/05/20 1320  BP: 112/71 108/71  112/71  Pulse: 74 77  81  Resp: (!) 22 20  (!) 25  Temp:   97.9 F (36.6 C)   TempSrc:   Axillary   SpO2: (!) 88% (!) 89%  (!) 89%  Weight:      Height:        Intake/Output Summary (Last 24 hours) at 02/05/2020 1353 Last data filed at 02/05/2020 1300 Gross per 24 hour  Intake 1714.23 ml  Output 2500 ml  Net -785.77 ml   Filed Weights   02/03/20 1114 02/04/20 1330  Weight: 79.4 kg 73.5 kg    Gen: 44 y.o. male in no distress Pulm: Tachypnea without accessory muscle use, 88-90% at rest on HFNC.  CV: Regular rate and rhythm. No murmur, rub, or gallop. No JVD,  no pedal edema. GI: Abdomen soft, non-tender, non-distended, with normoactive bowel sounds. No organomegaly or masses felt. Ext: Warm, no deformities Skin: No rashes, lesions no ulcers Neuro: Alert and oriented. No focal neurological deficits. Psych: Judgement and insight appear normal. Mood & affect appropriate.   Data Reviewed: I have personally reviewed following labs and imaging studies  CBC: Recent Labs  Lab 02/03/20 1202 02/04/20 0919 02/05/20 0238  WBC 7.5 6.1 8.8  NEUTROABS 5.8 4.7 7.0  HGB 12.8* 12.9* 13.7  HCT 38.9* 38.5* 41.9  MCV 88.4 87.3 89.1  PLT  253 323 411*   Basic Metabolic Panel: Recent Labs  Lab 02/03/20 1202 02/04/20 0919 02/05/20 0238  NA 136 136 138  K 4.4 4.1 4.8  CL 97* 101 101  CO2 27 27 28   GLUCOSE 158* 164* 182*  BUN 7 16 24*  CREATININE 0.83 0.76 0.92  CALCIUM 7.8* 8.3* 8.0*  MG  --   --  2.6*   GFR: Estimated Creatinine Clearance: 98.8 mL/min (by C-G formula based on SCr of 0.92 mg/dL). Liver Function Tests: Recent Labs  Lab 02/03/20 1202 02/04/20 0919 02/05/20 0238  AST 51* 52* 49*  ALT 55* 57* 64*  ALKPHOS 188* 192* 208*  BILITOT 0.4 0.4 0.8  PROT 7.6 7.1 7.6  ALBUMIN 3.1* 3.4* 3.1*   No results for input(s): LIPASE, AMYLASE in the last 168 hours. No results for input(s): AMMONIA in the last 168 hours. Coagulation Profile: No results for input(s): INR, PROTIME in the last 168 hours. Cardiac Enzymes: No results for input(s): CKTOTAL, CKMB, CKMBINDEX, TROPONINI in the last 168 hours. BNP (last 3 results) No results for input(s): PROBNP in the last 8760 hours. HbA1C: Recent Labs    02/05/20 0238  HGBA1C 6.1*   CBG: Recent Labs  Lab 02/05/20 0804 02/05/20 1220  GLUCAP 153* 149*   Lipid Profile: Recent Labs    02/03/20 1202  TRIG 159*   Thyroid Function Tests: No results for input(s): TSH, T4TOTAL, FREET4, T3FREE, THYROIDAB in the last 72 hours. Anemia Panel: Recent Labs    02/03/20 1202  FERRITIN 530*   Urine analysis: No results found for: COLORURINE, APPEARANCEUR, LABSPEC, PHURINE, GLUCOSEU, HGBUR, BILIRUBINUR, KETONESUR, PROTEINUR, UROBILINOGEN, NITRITE, LEUKOCYTESUR Recent Results (from the past 240 hour(s))  SARS Coronavirus 2 by RT PCR (hospital order, performed in [facility] hospital lab) Nasopharyngeal Nasopharyngeal Swab     Status: Abnormal   Collection Time: 02/03/20 12:02 PM   Specimen: Nasopharyngeal Swab  Result Value Ref Range Status   SARS Coronavirus 2 POSITIVE (A) NEGATIVE Final    Comment: RESULT CALLED TO, READ BACK BY AND VERIFIED WITH: MARVA SIMMS  RN @1328  02/03/2020 OLSONM (NOTE) SARS-CoV-2 target nucleic acids are DETECTED  SARS-CoV-2 RNA is generally detectable in upper respiratory specimens  during the acute phase of infection.  Positive results are indicative  of the presence of the identified virus, but do not rule out bacterial infection or co-infection with other pathogens not detected by the test.  Clinical correlation with patient history and  other diagnostic information is necessary to determine patient infection status.  The expected result is negative.  Fact Sheet for Patients:   BoilerBrush.com.cyhttps://www.fda.gov/media/136312/download   Fact Sheet for Healthcare Providers:   https://pope.com/https://www.fda.gov/media/136313/download    This test is not yet approved or cleared by the [place] FDA and  has been authorized for detection and/or diagnosis of SARS-CoV-2 by FDA under an Emergency Use Authorization (EUA).  This EUA will remain in effect (meaning thi s  test can be used) for the duration of  the COVID-19 declaration under Section 564(b)(1) of the Act, 21 U.S.C. section 360-bbb-3(b)(1), unless the authorization is terminated or revoked sooner.  Performed at [facility], [address] [place], [place] [address]   Blood Culture (routine x 2)     Status: None (Preliminary result)   Collection Time: 02/03/20 12:15 PM   Specimen: BLOOD  Result Value Ref Range Status   Specimen Description   Final    BLOOD LEFT ANTECUBITAL Performed at [facility], [address] [place], [place] [address]    Special Requests   Final    BOTTLES DRAWN AEROBIC AND ANAEROBIC Blood Culture adequate volume Performed at [facility], [address] [place], [place] [address]    Culture   Final    NO GROWTH < 24 HOURS Performed at [facility], [address] [place], [place] [address]    Report Status PENDING  Incomplete  Blood Culture (routine x 2)     Status: None (Preliminary result)   Collection  Time: 02/03/20 12:20 PM   Specimen: BLOOD  Result Value Ref Range Status   Specimen Description   Final    BLOOD BLOOD LEFT HAND Performed at [facility], [address] [place], [place] [address]    Special Requests   Final    BOTTLES DRAWN AEROBIC AND ANAEROBIC Blood Culture adequate volume Performed at [facility], [address] [place], [place] [address]    Culture   Final    NO GROWTH < 24 HOURS Performed at [facility], [address] [place], [place] [address]    Report Status PENDING  Incomplete  MRSA PCR Screening     Status: None   Collection Time: 02/04/20  1:46 PM   Specimen: Nasopharyngeal  Result Value Ref Range Status   MRSA by PCR NEGATIVE NEGATIVE Final    Comment:        The GeneXpert MRSA Assay (FDA approved for NASAL specimens only), is one component of a comprehensive MRSA colonization surveillance program. It is not intended to diagnose MRSA infection nor to guide or monitor treatment for MRSA infections. Performed at [facility], [address] [place], [place] [address]       Radiology Studies: CT ANGIO CHEST PE W OR WO CONTRAST  Result Date: 02/04/2020 CLINICAL DATA:  Shortness of breath, cough, fever. COVID-19 positive EXAM: CT ANGIOGRAPHY CHEST WITH CONTRAST TECHNIQUE: Multidetector CT imaging of the chest was performed using the standard protocol during bolus administration of intravenous contrast. Multiplanar CT image reconstructions and MIPs were obtained to evaluate the vascular anatomy. CONTRAST:  OMNIPAQUE IOHEXOL 350 MG/ML SOLN COMPARISON:  Chest x-ray 02/04/2020, 02/03/2020 FINDINGS: Cardiovascular: Satisfactory opacification of the pulmonary arteries to the segmental level. No evidence of pulmonary embolism. Normal heart size. No pericardial effusion. Thoracic aorta is normal in course and caliber. Mediastinum/Nodes: No enlarged mediastinal, hilar, or axillary lymph nodes. Thyroid  gland, trachea, and esophagus demonstrate no significant findings. Lungs/Pleura: Dense airspace consolidations with air bronchograms within the medial aspects of the bilateral lung bases. Multifocal areas of peripheral ground-glass opacity throughout both lung fields, most pronounced within the inferior aspect of the right upper lobe. No pleural effusion or pneumothorax. Upper Abdomen: Cholelithiasis. Musculoskeletal: No chest wall abnormality. No acute or significant osseous findings. Review of the MIP images confirms the above findings. IMPRESSION: 1.  No evidence of pulmonary embolism. 2. Dense airspace consolidations with air bronchograms within the medial aspects of the bilateral lung bases, with additional multifocal areas of peripheral ground-glass opacity throughout both lung fields. Findings are most consistent with multifocal pneumonia in the setting of known COVID-19 infection. 3. Cholelithiasis. Electronically Signed   By: Duanne Guess D.O.   On: 02/04/2020 16:51   DG CHEST PORT 1 VIEW  Result Date: 02/04/2020 CLINICAL DATA:  Eval for infection. EXAM: PORTABLE CHEST 1 VIEW COMPARISON:  02/03/2020 chest radiograph. FINDINGS: Hypoinflated lungs. Bilateral perihilar and bibasilar patchy opacities are increased. No pneumothorax. Trace left pleural effusion, unchanged. Cardiomediastinal silhouette is partially obscured. Osseous structures are unchanged. IMPRESSION: Mildly increased bilateral perihilar/bibasilar opacities are concerning for infection. Trace left pleural effusion, unchanged. Electronically Signed   By: Stana Bunting M.D.   On: 02/04/2020 15:05    Scheduled Meds: . vitamin C  500 mg Oral Daily  . Chlorhexidine Gluconate Cloth  6 each Topical Daily  . enoxaparin (LOVENOX) injection  40 mg Subcutaneous Q24H  . famotidine  20 mg Oral Daily  . insulin aspart  0-15 Units Subcutaneous TID WC  . insulin aspart  0-5 Units Subcutaneous QHS  . linagliptin  5 mg Oral Daily  .  methylPREDNISolone (SOLU-MEDROL) injection  40 mg Intravenous Q8H  . sodium chloride flush  3 mL Intravenous Q12H  . zinc sulfate  220 mg Oral Daily   Continuous Infusions: . sodium chloride Stopped (02/04/20 2116)  . remdesivir 100 mg in NS 100 mL Stopped (02/05/20 1103)     LOS: 1 day   Time spent: 35 minutes.  Tyrone Nine, MD [facility] [email or web address] 02/05/2020, 1:53 PM

## 2020-02-06 LAB — COMPREHENSIVE METABOLIC PANEL
ALT: 97 U/L — ABNORMAL HIGH (ref 0–44)
AST: 92 U/L — ABNORMAL HIGH (ref 15–41)
Albumin: 2.9 g/dL — ABNORMAL LOW (ref 3.5–5.0)
Alkaline Phosphatase: 174 U/L — ABNORMAL HIGH (ref 38–126)
Anion gap: 8 (ref 5–15)
BUN: 20 mg/dL (ref 6–20)
CO2: 26 mmol/L (ref 22–32)
Calcium: 7.7 mg/dL — ABNORMAL LOW (ref 8.9–10.3)
Chloride: 101 mmol/L (ref 98–111)
Creatinine, Ser: 0.8 mg/dL (ref 0.61–1.24)
GFR calc Af Amer: >60 mL/min (ref >60)
GFR calc non Af Amer: >60 mL/min (ref >60)
Glucose, Bld: 176 mg/dL — ABNORMAL HIGH (ref 70–99)
Potassium: 4.8 mmol/L (ref 3.5–5.1)
Sodium: 135 mmol/L (ref 135–145)
Total Bilirubin: 0.7 mg/dL (ref 0.3–1.2)
Total Protein: 6.9 g/dL (ref 6.5–8.1)

## 2020-02-06 LAB — CBC WITH DIFFERENTIAL/PLATELET
Abs Immature Granulocytes: 0.5 10*3/uL — ABNORMAL HIGH (ref 0.00–0.07)
Basophils Absolute: 0.1 10*3/uL (ref 0.0–0.1)
Basophils Relative: 0 %
Eosinophils Absolute: 0 10*3/uL (ref 0.0–0.5)
Eosinophils Relative: 0 %
HCT: 42.2 % (ref 39.0–52.0)
Hemoglobin: 13.9 g/dL (ref 13.0–17.0)
Immature Granulocytes: 4 %
Lymphocytes Relative: 10 %
Lymphs Abs: 1.2 10*3/uL (ref 0.7–4.0)
MCH: 29.6 pg (ref 26.0–34.0)
MCHC: 32.9 g/dL (ref 30.0–36.0)
MCV: 89.8 fL (ref 80.0–100.0)
Monocytes Absolute: 0.8 10*3/uL (ref 0.1–1.0)
Monocytes Relative: 6 %
Neutro Abs: 9.8 10*3/uL — ABNORMAL HIGH (ref 1.7–7.7)
Neutrophils Relative %: 80 %
Platelets: 450 10*3/uL — ABNORMAL HIGH (ref 150–400)
RBC: 4.7 MIL/uL (ref 4.22–5.81)
RDW: 13.4 % (ref 11.5–15.5)
WBC: 12.3 10*3/uL — ABNORMAL HIGH (ref 4.0–10.5)
nRBC: 0.3 % — ABNORMAL HIGH (ref 0.0–0.2)

## 2020-02-06 LAB — GLUCOSE, CAPILLARY
Glucose-Capillary: 142 mg/dL — ABNORMAL HIGH (ref 70–99)
Glucose-Capillary: 177 mg/dL — ABNORMAL HIGH (ref 70–99)
Glucose-Capillary: 179 mg/dL — ABNORMAL HIGH (ref 70–99)
Glucose-Capillary: 175 mg/dL — ABNORMAL HIGH (ref 70–99)

## 2020-02-06 LAB — MAGNESIUM: Magnesium: 2.7 mg/dL — ABNORMAL HIGH (ref 1.7–2.4)

## 2020-02-06 LAB — D-DIMER, QUANTITATIVE: D-Dimer, Quant: 1.11 ug/mL-FEU — ABNORMAL HIGH (ref 0.00–0.50)

## 2020-02-06 LAB — C-REACTIVE PROTEIN: CRP: 5.1 mg/dL — ABNORMAL HIGH (ref <1.0)

## 2020-02-06 LAB — HEPATITIS PANEL, ACUTE
HCV Ab: NONREACTIVE
Hep A IgM: NONREACTIVE
Hep B C IgM: NONREACTIVE
Hepatitis B Surface Ag: NONREACTIVE

## 2020-02-06 NOTE — Progress Notes (Signed)
PROGRESS NOTE  Nimesh Riolo  BMW:[id number] DOB: [birth year] DOA: 02/03/2020 PCP: None per pt Brief Narrative: Nealy Karapetian is a 44 y.o. male with no known medical history who presented to the ED 8/4 with shortness of breath that had worsened since 8/1, found to be hypoxemic requiring 4L O2 with positive SARS-CoV-2 PCR, elevated CRP at 18.8 with negative procalcitonin, WBC 6.1k, and CXR infiltrates consistent with covid-19 pneumonia. Remdesivir and steroids were started and he was admitted to [facility]. Hypoxemia worsened and CTA showed no PE but confirmed bilateral infiltrates. Tocilizumab was administered 8/5. He remains on 10-12L HFNC.  Assessment & Plan: Principal Problem:   Pneumonia due to COVID-19 virus Active Problems:   Acute respiratory failure with hypoxia (HCC)  Acute hypoxemic respiratory failure due to covid-19 pneumonia: SARS-CoV-2 PCR positive on 8/4, not vaccinated. CRP declining 18.8 > 5.1. - Continue remdesivir x5 days (8/4 - 8/8) - Continue steroids x10 days (8/4 - 8/13)  - s/p tocilizumab 8/5.  - Encourage OOB, IS, FV, and awake proning if able - Continue airborne, contact precautions for 21 days from positive testing. - Monitor CMP and inflammatory markers - Enoxaparin prophylactic dose. No PE on recent CTA. DD ~1. - Discussed briefly with PCCM without formal consult. Hypoxemia seems to have stabilized on HFNC. Pt will be watched closely in SDU/ICU.  Steroid-induced hyperglycemia, prediabetes: HbA1c 6.1%. - Carb-modified diet - Started linagliptin, moderate SSI, at inpatient goal  LFT elevation: Likely related to viral infection. - Monitor daily, upward trend but not contraindication to current therapies.  Normocytic anemia: Resolved.  Lateral ST segment changes: No chest pain, no troponin elevation. Echocardiogram without LV or RV dysfunction or wall motion abnormalities.   DVT prophylaxis: Lovenox 40mg  q24h Code Status: Full Family Communication: None at  bedside Disposition Plan:  Status is: Inpatient  Remains inpatient appropriate because:Inpatient level of care appropriate due to severity of illness  Dispo: The patient is from: Home              Anticipated d/c is to: Home              Anticipated d/c date is: > 3 days              Patient currently is not medically stable to d/c.  Consultants:   None  Procedures:   None  Antimicrobials:  Remdesivir 8/4 - 8/8  Subjective: Shortness of breath is moderate, constant, worse with exertion, improved with rest and with supplemental oxygen. Having tough time sleeping. No chest pain, leg swelling, wheezing, orthopnea.   Objective: Vitals:   02/06/20 1000 02/06/20 1100 02/06/20 1128 02/06/20 1139  BP: 127/77 120/73    Pulse: 72 72    Resp: 19 (!) 25    Temp:   97.6 F (36.4 C) 98.4 F (36.9 C)  TempSrc:   Oral Oral  SpO2: 95% (!) 87% 90%   Weight:      Height:        Intake/Output Summary (Last 24 hours) at 02/06/2020 1319 Last data filed at 02/06/2020 0900 Gross per 24 hour  Intake 1729.84 ml  Output 1000 ml  Net 729.84 ml   Filed Weights   02/03/20 1114 02/04/20 1330  Weight: 79.4 kg 73.5 kg   Gen: 44 y.o. male in no acute distress Pulm: Tachypnea without accessory muscle use on 10L HFNC, crackles diffusely without wheezes, stable. CV: Regular rate and rhythm. No murmur, rub, or gallop. No JVD, no dependent edema. GI: Abdomen soft, non-tender, non-distended,  with normoactive bowel sounds.  Ext: Warm, no deformities Skin: No rashes, lesions or ulcers on visualized skin. Neuro: Alert and oriented. No focal neurological deficits. Psych: Judgement and insight appear fair. Mood euthymic & affect congruent. Behavior is appropriate.    Data Reviewed: I have personally reviewed following labs and imaging studies  CBC: Recent Labs  Lab 02/03/20 1202 02/04/20 0919 02/05/20 0238 02/06/20 0236  WBC 7.5 6.1 8.8 12.3*  NEUTROABS 5.8 4.7 7.0 9.8*  HGB 12.8* 12.9* 13.7  13.9  HCT 38.9* 38.5* 41.9 42.2  MCV 88.4 87.3 89.1 89.8  PLT 253 323 411* 450*   Basic Metabolic Panel: Recent Labs  Lab 02/03/20 1202 02/04/20 0919 02/05/20 0238 02/06/20 0236  NA 136 136 138 135  K 4.4 4.1 4.8 4.8  CL 97* 101 101 101  CO2 27 27 28 26   GLUCOSE 158* 164* 182* 176*  BUN 7 16 24* 20  CREATININE 0.83 0.76 0.92 0.80  CALCIUM 7.8* 8.3* 8.0* 7.7*  MG  --   --  2.6* 2.7*   GFR: Estimated Creatinine Clearance: 113.6 mL/min (by C-G formula based on SCr of 0.8 mg/dL). Liver Function Tests: Recent Labs  Lab 02/03/20 1202 02/04/20 0919 02/05/20 0238 02/06/20 0236  AST 51* 52* 49* 92*  ALT 55* 57* 64* 97*  ALKPHOS 188* 192* 208* 174*  BILITOT 0.4 0.4 0.8 0.7  PROT 7.6 7.1 7.6 6.9  ALBUMIN 3.1* 3.4* 3.1* 2.9*   No results for input(s): LIPASE, AMYLASE in the last 168 hours. No results for input(s): AMMONIA in the last 168 hours. Coagulation Profile: No results for input(s): INR, PROTIME in the last 168 hours. Cardiac Enzymes: No results for input(s): CKTOTAL, CKMB, CKMBINDEX, TROPONINI in the last 168 hours. BNP (last 3 results) No results for input(s): PROBNP in the last 8760 hours. HbA1C: Recent Labs    02/05/20 0238  HGBA1C 6.1*   CBG: Recent Labs  Lab 02/05/20 1220 02/05/20 1727 02/05/20 2136 02/06/20 0736 02/06/20 1137  GLUCAP 149* 155* 173* 142* 177*   Lipid Profile: No results for input(s): CHOL, HDL, LDLCALC, TRIG, CHOLHDL, LDLDIRECT in the last 72 hours. Thyroid Function Tests: No results for input(s): TSH, T4TOTAL, FREET4, T3FREE, THYROIDAB in the last 72 hours. Anemia Panel: No results for input(s): VITAMINB12, FOLATE, FERRITIN, TIBC, IRON, RETICCTPCT in the last 72 hours. Urine analysis: No results found for: COLORURINE, APPEARANCEUR, LABSPEC, PHURINE, GLUCOSEU, HGBUR, BILIRUBINUR, KETONESUR, PROTEINUR, UROBILINOGEN, NITRITE, LEUKOCYTESUR Recent Results (from the past 240 hour(s))  SARS Coronavirus 2 by RT PCR (hospital order,  performed in [facility] hospital lab) Nasopharyngeal Nasopharyngeal Swab     Status: Abnormal   Collection Time: 02/03/20 12:02 PM   Specimen: Nasopharyngeal Swab  Result Value Ref Range Status   SARS Coronavirus 2 POSITIVE (A) NEGATIVE Final    Comment: RESULT CALLED TO, READ BACK BY AND VERIFIED WITH: MARVA SIMMS RN @1328  02/03/2020 OLSONM (NOTE) SARS-CoV-2 target nucleic acids are DETECTED  SARS-CoV-2 RNA is generally detectable in upper respiratory specimens  during the acute phase of infection.  Positive results are indicative  of the presence of the identified virus, but do not rule out bacterial infection or co-infection with other pathogens not detected by the test.  Clinical correlation with patient history and  other diagnostic information is necessary to determine patient infection status.  The expected result is negative.  Fact Sheet for Patients:   BoilerBrush.com.cyhttps://www.fda.gov/media/136312/download   Fact Sheet for Healthcare Providers:   https://pope.com/https://www.fda.gov/media/136313/download    This test is not  yet approved or cleared by the [place] and  has been authorized for detection and/or diagnosis of SARS-CoV-2 by FDA under an Emergency Use Authorization (EUA).  This EUA will remain in effect (meaning thi s test can be used) for the duration of  the COVID-19 declaration under Section 564(b)(1) of the Act, 21 U.S.C. section 360-bbb-3(b)(1), unless the authorization is terminated or revoked sooner.  Performed at [facility], [address] [place], [place] [address]   Blood Culture (routine x 2)     Status: None (Preliminary result)   Collection Time: 02/03/20 12:15 PM   Specimen: BLOOD  Result Value Ref Range Status   Specimen Description   Final    BLOOD LEFT ANTECUBITAL Performed at [facility], [address] [place], [place] [address]    Special Requests   Final    BOTTLES DRAWN AEROBIC AND ANAEROBIC Blood Culture adequate  volume Performed at [facility], [address] [place], [place] [address]    Culture   Final    NO GROWTH 3 DAYS Performed at [facility], [address] [place], [place] [address]    Report Status PENDING  Incomplete  Blood Culture (routine x 2)     Status: None (Preliminary result)   Collection Time: 02/03/20 12:20 PM   Specimen: BLOOD  Result Value Ref Range Status   Specimen Description   Final    BLOOD BLOOD LEFT HAND Performed at [facility], [address] [place], [place] [address]    Special Requests   Final    BOTTLES DRAWN AEROBIC AND ANAEROBIC Blood Culture adequate volume Performed at [facility], [address] [place], [place] [address]    Culture   Final    NO GROWTH 3 DAYS Performed at [facility], [address], [place], [place] [address]    Report Status PENDING  Incomplete  MRSA PCR Screening     Status: None   Collection Time: 02/04/20  1:46 PM   Specimen: Nasopharyngeal  Result Value Ref Range Status   MRSA by PCR NEGATIVE NEGATIVE Final    Comment:        The GeneXpert MRSA Assay (FDA approved for NASAL specimens only), is one component of a comprehensive MRSA colonization surveillance program. It is not intended to diagnose MRSA infection nor to guide or monitor treatment for MRSA infections. Performed at [facility], [address], [place], [place] [address]       Radiology Studies: CT ANGIO CHEST PE W OR WO CONTRAST  Result Date: 02/04/2020 CLINICAL DATA:  Shortness of breath, cough, fever. COVID-19 positive EXAM: CT ANGIOGRAPHY CHEST WITH CONTRAST TECHNIQUE: Multidetector CT imaging of the chest was performed using the standard protocol during bolus administration of intravenous contrast. Multiplanar CT image reconstructions and MIPs were obtained to evaluate the vascular anatomy. CONTRAST:  OMNIPAQUE IOHEXOL 350 MG/ML SOLN COMPARISON:  Chest x-ray 02/04/2020,  02/03/2020 FINDINGS: Cardiovascular: Satisfactory opacification of the pulmonary arteries to the segmental level. No evidence of pulmonary embolism. Normal heart size. No pericardial effusion. Thoracic aorta is normal in course and caliber. Mediastinum/Nodes: No enlarged mediastinal, hilar, or axillary lymph nodes. Thyroid gland, trachea, and esophagus demonstrate no significant findings. Lungs/Pleura: Dense airspace consolidations with air bronchograms within the medial aspects of the bilateral lung bases. Multifocal areas of peripheral ground-glass opacity throughout both lung fields, most pronounced within the inferior  aspect of the right upper lobe. No pleural effusion or pneumothorax. Upper Abdomen: Cholelithiasis. Musculoskeletal: No chest wall abnormality. No acute or significant osseous findings. Review of the MIP images confirms the above findings. IMPRESSION: 1. No evidence of pulmonary embolism. 2. Dense airspace consolidations with air bronchograms within the medial aspects of the bilateral lung bases, with additional multifocal areas of peripheral ground-glass opacity throughout both lung fields. Findings are most consistent with multifocal pneumonia in the setting of known COVID-19 infection. 3. Cholelithiasis. Electronically Signed   By: Duanne Guess D.O.   On: 02/04/2020 16:51   DG CHEST PORT 1 VIEW  Result Date: 02/04/2020 CLINICAL DATA:  Eval for infection. EXAM: PORTABLE CHEST 1 VIEW COMPARISON:  02/03/2020 chest radiograph. FINDINGS: Hypoinflated lungs. Bilateral perihilar and bibasilar patchy opacities are increased. No pneumothorax. Trace left pleural effusion, unchanged. Cardiomediastinal silhouette is partially obscured. Osseous structures are unchanged. IMPRESSION: Mildly increased bilateral perihilar/bibasilar opacities are concerning for infection. Trace left pleural effusion, unchanged. Electronically Signed   By: Stana Bunting M.D.   On: 02/04/2020 15:05   ECHOCARDIOGRAM  COMPLETE  Result Date: 02/05/2020    ECHOCARDIOGRAM REPORT   Patient Name:   RAMAL ECKHARDT Date of Exam: 02/05/2020 Medical Rec #:  [id number]     Height:       67.0 in Accession #:    [id number]    Weight:       162.0 lb Date of Birth:  [birth year]     BSA:          1.849 m Patient Age:    43 years      BP:           112/71 mmHg Patient Gender: M             HR:           81 bpm. Exam Location:  Inpatient Procedure: 2D Echo Indications:    794.31 abnormal ECG  History:        Patient has no prior history of Echocardiogram examinations.                 Covid + . no prior cardiac hx on file.  Sonographer:    Celene Skeen RDCS (AE) Referring Phys: [id number] [address]val[address] Shipper IMPRESSIONS  1. Left ventricular ejection fraction, by estimation, is 60 to 65%. The left ventricle has normal function. The left ventricle has no regional wall motion abnormalities. Left ventricular diastolic parameters were normal.  2. Right ventricular systolic function is normal. The right ventricular size is normal.  3. The mitral valve is normal in structure. Trivial mitral valve regurgitation. No evidence of mitral stenosis.  4. The aortic valve is tricuspid. Aortic valve regurgitation is not visualized. No aortic stenosis is present. FINDINGS  Left Ventricle: Left ventricular ejection fraction, by estimation, is 60 to 65%. The left ventricle has normal function. The left ventricle has no regional wall motion abnormalities. The left ventricular internal cavity size was normal in size. There is  no left ventricular hypertrophy. Left ventricular diastolic parameters were normal. Right Ventricle: The right ventricular size is normal.Right ventricular systolic function is normal. Left Atrium: Left atrial size was normal in size. Right Atrium: Right atrial size was normal in size. Pericardium: There is no evidence of pericardial effusion. Mitral Valve: The mitral valve is normal in structure. Normal mobility of the mitral valve leaflets. Trivial mitral  valve regurgitation. No evidence of mitral valve stenosis. Tricuspid Valve: The tricuspid valve is normal in structure. Tricuspid  valve regurgitation is trivial. No evidence of tricuspid stenosis. Aortic Valve: The aortic valve is tricuspid. Aortic valve regurgitation is not visualized. No aortic stenosis is present. Pulmonic Valve: The pulmonic valve was normal in structure. Pulmonic valve regurgitation is trivial. No evidence of pulmonic stenosis. Aorta: The aortic root is normal in size and structure. Venous: The inferior vena cava was not well visualized.   LEFT VENTRICLE PLAX 2D LVIDd:         5.20 cm  Diastology LVIDs:         3.05 cm  LV e' lateral:   14.80 cm/s LV PW:         1.00 cm  LV E/e' lateral: 5.4 LV IVS:        0.80 cm  LV e' medial:    10.00 cm/s LVOT diam:     2.00 cm  LV E/e' medial:  8.1 LV SV:         71 LV SV Index:   38 LVOT Area:     3.14 cm  RIGHT VENTRICLE RV S prime:     14.80 cm/s TAPSE (M-mode): 1.7 cm LEFT ATRIUM             Index LA diam:        3.80 cm 2.05 cm/m LA Vol (A2C):   28.2 ml 15.25 ml/m LA Vol (A4C):   43.7 ml 23.63 ml/m LA Biplane Vol: 35.8 ml 19.36 ml/m  AORTIC VALVE LVOT Vmax:   108.00 cm/s LVOT Vmean:  75.300 cm/s LVOT VTI:    0.226 m  AORTA Ao Root diam: 3.10 cm MITRAL VALVE MV Area (PHT): 3.19 cm    SHUNTS MV Decel Time: 238 msec    Systemic VTI:  0.23 m MV E velocity: 80.60 cm/s  Systemic Diam: 2.00 cm MV A velocity: 66.40 cm/s MV E/A ratio:  1.21 Olga Millers MD Electronically signed by Olga Millers MD Signature Date/Time: 02/05/2020/2:57:48 PM    Final     Scheduled Meds: . vitamin C  500 mg Oral Daily  . Chlorhexidine Gluconate Cloth  6 each Topical Daily  . enoxaparin (LOVENOX) injection  40 mg Subcutaneous Q24H  . famotidine  20 mg Oral Daily  . insulin aspart  0-15 Units Subcutaneous TID WC  . insulin aspart  0-5 Units Subcutaneous QHS  . linagliptin  5 mg Oral Daily  . methylPREDNISolone (SOLU-MEDROL) injection  40 mg Intravenous Q8H  .  sodium chloride flush  3 mL Intravenous Q12H  . zinc sulfate  220 mg Oral Daily   Continuous Infusions: . sodium chloride Stopped (02/04/20 2116)  . remdesivir 100 mg in NS 100 mL Stopped (02/06/20 1336)     LOS: 2 days   Time spent: 35 minutes.  Tyrone Nine, MD [facility] [email or web address] 02/06/2020, 1:19 PM

## 2020-02-07 DIAGNOSIS — J1282 Pneumonia due to coronavirus disease 2019: Secondary | ICD-10-CM

## 2020-02-07 DIAGNOSIS — U071 COVID-19: Principal | ICD-10-CM

## 2020-02-07 LAB — CBC WITH DIFFERENTIAL/PLATELET
Abs Immature Granulocytes: 0.6 10*3/uL — ABNORMAL HIGH (ref 0.00–0.07)
Basophils Absolute: 0.1 10*3/uL (ref 0.0–0.1)
Basophils Relative: 1 %
Eosinophils Absolute: 0 10*3/uL (ref 0.0–0.5)
Eosinophils Relative: 0 %
HCT: 40.6 % (ref 39.0–52.0)
Hemoglobin: 13.3 g/dL (ref 13.0–17.0)
Immature Granulocytes: 5 %
Lymphocytes Relative: 9 %
Lymphs Abs: 1.1 10*3/uL (ref 0.7–4.0)
MCH: 29.6 pg (ref 26.0–34.0)
MCHC: 32.8 g/dL (ref 30.0–36.0)
MCV: 90.2 fL (ref 80.0–100.0)
Monocytes Absolute: 0.8 10*3/uL (ref 0.1–1.0)
Monocytes Relative: 6 %
Neutro Abs: 10.1 10*3/uL — ABNORMAL HIGH (ref 1.7–7.7)
Neutrophils Relative %: 79 %
Platelets: 490 10*3/uL — ABNORMAL HIGH (ref 150–400)
RBC: 4.5 MIL/uL (ref 4.22–5.81)
RDW: 13.5 % (ref 11.5–15.5)
WBC: 12.7 10*3/uL — ABNORMAL HIGH (ref 4.0–10.5)
nRBC: 0.5 % — ABNORMAL HIGH (ref 0.0–0.2)

## 2020-02-07 LAB — C-REACTIVE PROTEIN: CRP: 2.9 mg/dL — ABNORMAL HIGH (ref <1.0)

## 2020-02-07 LAB — COMPREHENSIVE METABOLIC PANEL
ALT: 143 U/L — ABNORMAL HIGH (ref 0–44)
AST: 73 U/L — ABNORMAL HIGH (ref 15–41)
Albumin: 2.8 g/dL — ABNORMAL LOW (ref 3.5–5.0)
Alkaline Phosphatase: 169 U/L — ABNORMAL HIGH (ref 38–126)
Anion gap: 7 (ref 5–15)
BUN: 23 mg/dL — ABNORMAL HIGH (ref 6–20)
CO2: 25 mmol/L (ref 22–32)
Calcium: 7.6 mg/dL — ABNORMAL LOW (ref 8.9–10.3)
Chloride: 104 mmol/L (ref 98–111)
Creatinine, Ser: 0.7 mg/dL (ref 0.61–1.24)
GFR calc Af Amer: >60 mL/min (ref >60)
GFR calc non Af Amer: >60 mL/min (ref >60)
Glucose, Bld: 159 mg/dL — ABNORMAL HIGH (ref 70–99)
Potassium: 4.6 mmol/L (ref 3.5–5.1)
Sodium: 136 mmol/L (ref 135–145)
Total Bilirubin: 0.7 mg/dL (ref 0.3–1.2)
Total Protein: 6.5 g/dL (ref 6.5–8.1)

## 2020-02-07 LAB — GLUCOSE, CAPILLARY
Glucose-Capillary: 139 mg/dL — ABNORMAL HIGH (ref 70–99)
Glucose-Capillary: 171 mg/dL — ABNORMAL HIGH (ref 70–99)
Glucose-Capillary: 200 mg/dL — ABNORMAL HIGH (ref 70–99)
Glucose-Capillary: 175 mg/dL — ABNORMAL HIGH (ref 70–99)

## 2020-02-07 LAB — D-DIMER, QUANTITATIVE: D-Dimer, Quant: 1.34 ug/mL-FEU — ABNORMAL HIGH (ref 0.00–0.50)

## 2020-02-07 MED ORDER — PHENOL 1.4 % MT LIQD
1.0000 | OROMUCOSAL | Status: DC | PRN
  Filled 2020-02-07: qty 177

## 2020-02-07 MED ORDER — MENTHOL 3 MG MT LOZG
1.0000 | LOZENGE | OROMUCOSAL | Status: DC | PRN
  Administered 2020-02-07: 3 mg via ORAL
  Filled 2020-02-07: qty 9

## 2020-02-07 NOTE — Progress Notes (Signed)
PROGRESS NOTE  Victor Rich  ACZ:[id number] DOB: 02/01/1976 DOA: 02/03/2020 PCP: None per pt Brief Narrative: Victor Rich is a 44 y.o. male with no known medical history who presented to the ED 8/4 with shortness of breath that had worsened since 8/1, found to be hypoxemic requiring 4L O2 with positive SARS-CoV-2 PCR, elevated CRP at 18.8 with negative procalcitonin, WBC 6.1k, and CXR infiltrates consistent with covid-19 pneumonia. Remdesivir and steroids were started and he was admitted to [facility]. Hypoxemia worsened and CTA showed no PE but confirmed bilateral infiltrates. Tocilizumab was administered 8/5. He remains on 10-12L HFNC.  Assessment & Plan: Principal Problem:   Pneumonia due to COVID-19 virus Active Problems:   Acute respiratory failure with hypoxia (HCC)  Acute hypoxemic respiratory failure due to covid-19 pneumonia: SARS-CoV-2 PCR positive on 8/4, not vaccinated.  -Completed remdesivir 5-day course on 02/07/20 - Continue steroids x10 days (8/4 - 8/13)  - s/p tocilizumab 8/5 -labs trending downwards appropriately as below.  - Encourage OOB, IS, FV, and proning if able - Continue airborne, contact precautions for 21 days from positive testing. - Enoxaparin prophylactic dose. No PE on recent CTA. SpO2: 91 % O2 Flow Rate (L/min): 9 L/min Recent Labs    02/05/20 0238 02/06/20 0236 02/07/20 0112  DDIMER 1.01* 1.11* 1.34*  CRP 11.6* 5.1* 2.9*   Lab Results  Component Value Date   SARSCOV2NAA POSITIVE (A) 02/03/2020   Steroid-induced hyperglycemia, prediabetes: HbA1c 6.1%. - Carb-modified diet - Started linagliptin, moderate SSI, at inpatient goal  Leukemoid reaction, mild  -In the setting of steroids as above, continue to follow  LFT elevation: Likely related to viral infection. - Monitor daily, upward trend but not contraindication to current therapies. Hepatic Function Latest Ref Rng & Units 02/07/2020 02/06/2020 02/05/2020  Total Protein 6.5 - 8.1 g/dL 6.5 6.9 7.6    Albumin 3.5 - 5.0 g/dL 2.8(L) 2.9(L) 3.1(L)  AST 15 - 41 U/L 73(H) 92(H) 49(H)  ALT 0 - 44 U/L 143(H) 97(H) 64(H)  Alk Phosphatase 38 - 126 U/L 169(H) 174(H) 208(H)  Total Bilirubin 0.3 - 1.2 mg/dL 0.7 0.7 0.8   Lateral ST segment changes:  No active chest pain, no troponin elevation. Echocardiogram without LV or RV dysfunction or wall motion abnormalities.   DVT prophylaxis: Lovenox 54m q24h Code Status: Full Family Communication: None at bedside Disposition Plan:  Status is: Inpatient  Remains inpatient appropriate because:Inpatient level of care appropriate due to severity of illness  Dispo: The patient is from: Home              Anticipated d/c is to: Home              Anticipated d/c date is: > 3 days              Patient currently is not medically stable to d/c.  Consultants:   None  Procedures:   None  Antimicrobials:  Remdesivir 8/4 - 8/8  Subjective: No acute issues or events overnight, patient's shortness of breath continues to improve albeit minimally denies nausea, vomiting, diarrhea, constipation, headache, fevers, chills.  He does indicate ongoing worsening dyspnea with exertion but again improving from admission.  Objective: Vitals:   02/07/20 0500 02/07/20 0800 02/07/20 1100 02/07/20 1213  BP: 105/65 113/80  117/67  Pulse: (!) 53 (!) 53 (!) 58 66  Resp: 18 20 (!) 21 20  Temp: 97.8 F (36.6 C) 98.5 F (36.9 C)  98 F (36.7 C)  TempSrc: Oral   Oral  SpO2: 95% 95% 92% 91%  Weight:      Height:        Intake/Output Summary (Last 24 hours) at 02/07/2020 1507 Last data filed at 02/07/2020 1200 Gross per 24 hour  Intake 360 ml  Output 1100 ml  Net -740 ml   Filed Weights   02/03/20 1114 02/04/20 1330  Weight: 79.4 kg 73.5 kg   Gen: 44 y.o. male in no acute distress, resting comfortably in bedside chair Pulm: Mild tachypnea without accessory muscle use, crackles diffusely without wheezes, stable. CV: Regular rate and rhythm. No murmur, rub, or  gallop. No JVD, no dependent edema. GI: Abdomen soft, non-tender, non-distended, with normoactive bowel sounds.  Ext: Warm, no deformities Skin: No rashes, lesions or ulcers on visualized skin. Neuro: Alert and oriented. No focal neurological deficits. Psych: Judgement and insight appear fair. Mood euthymic & affect congruent. Behavior is appropriate.    Data Reviewed: I have personally reviewed following labs and imaging studies  CBC: Recent Labs  Lab 02/03/20 1202 02/04/20 0919 02/05/20 0238 02/06/20 0236 02/07/20 0112  WBC 7.5 6.1 8.8 12.3* 12.7*  NEUTROABS 5.8 4.7 7.0 9.8* 10.1*  HGB 12.8* 12.9* 13.7 13.9 13.3  HCT 38.9* 38.5* 41.9 42.2 40.6  MCV 88.4 87.3 89.1 89.8 90.2  PLT 253 323 411* 450* 767*   Basic Metabolic Panel: Recent Labs  Lab 02/03/20 1202 02/04/20 0919 02/05/20 0238 02/06/20 0236 02/07/20 0112  NA 136 136 138 135 136  K 4.4 4.1 4.8 4.8 4.6  CL 97* 101 101 101 104  CO2 27 27 28 26 25   GLUCOSE 158* 164* 182* 176* 159*  BUN 7 16 24* 20 23*  CREATININE 0.83 0.76 0.92 0.80 0.70  CALCIUM 7.8* 8.3* 8.0* 7.7* 7.6*  MG  --   --  2.6* 2.7*  --    GFR: Estimated Creatinine Clearance: 113.6 mL/min (by C-G formula based on SCr of 0.7 mg/dL). Liver Function Tests: Recent Labs  Lab 02/03/20 1202 02/04/20 0919 02/05/20 0238 02/06/20 0236 02/07/20 0112  AST 51* 52* 49* 92* 73*  ALT 55* 57* 64* 97* 143*  ALKPHOS 188* 192* 208* 174* 169*  BILITOT 0.4 0.4 0.8 0.7 0.7  PROT 7.6 7.1 7.6 6.9 6.5  ALBUMIN 3.1* 3.4* 3.1* 2.9* 2.8*   No results for input(s): LIPASE, AMYLASE in the last 168 hours. No results for input(s): AMMONIA in the last 168 hours. Coagulation Profile: No results for input(s): INR, PROTIME in the last 168 hours. Cardiac Enzymes: No results for input(s): CKTOTAL, CKMB, CKMBINDEX, TROPONINI in the last 168 hours. BNP (last 3 results) No results for input(s): PROBNP in the last 8760 hours. HbA1C: Recent Labs    02/05/20 0238  HGBA1C  6.1*   CBG: Recent Labs  Lab 02/06/20 1137 02/06/20 1553 02/06/20 2051 02/07/20 0728 02/07/20 1212  GLUCAP 177* 179* 175* 139* 171*   Lipid Profile: No results for input(s): CHOL, HDL, LDLCALC, TRIG, CHOLHDL, LDLDIRECT in the last 72 hours. Thyroid Function Tests: No results for input(s): TSH, T4TOTAL, FREET4, T3FREE, THYROIDAB in the last 72 hours. Anemia Panel: No results for input(s): VITAMINB12, FOLATE, FERRITIN, TIBC, IRON, RETICCTPCT in the last 72 hours. Urine analysis: No results found for: COLORURINE, APPEARANCEUR, LABSPEC, PHURINE, GLUCOSEU, HGBUR, BILIRUBINUR, KETONESUR, PROTEINUR, UROBILINOGEN, NITRITE, LEUKOCYTESUR Recent Results (from the past 240 hour(s))  SARS Coronavirus 2 by RT PCR (hospital order, performed in [facility] hospital lab) Nasopharyngeal Nasopharyngeal Swab     Status: Abnormal   Collection Time: 02/03/20 12:02 PM  Specimen: Nasopharyngeal Swab  Result Value Ref Range Status   SARS Coronavirus 2 POSITIVE (A) NEGATIVE Final    Comment: RESULT CALLED TO, READ BACK BY AND VERIFIED WITH: MARVA SIMMS RN @1328  02/03/2020 OLSONM (NOTE) SARS-CoV-2 target nucleic acids are DETECTED  SARS-CoV-2 RNA is generally detectable in upper respiratory specimens  during the acute phase of infection.  Positive results are indicative  of the presence of the identified virus, but do not rule out bacterial infection or co-infection with other pathogens not detected by the test.  Clinical correlation with patient history and  other diagnostic information is necessary to determine patient infection status.  The expected result is negative.  Fact Sheet for Patients:   StrictlyIdeas.no   Fact Sheet for Healthcare Providers:   BankingDealers.co.za    This test is not yet approved or cleared by the [place] FDA and  has been authorized for detection and/or diagnosis of SARS-CoV-2 by FDA under an Emergency Use  Authorization (EUA).  This EUA will remain in effect (meaning thi s test can be used) for the duration of  the COVID-19 declaration under Section 564(b)(1) of the Act, 21 U.S.C. section 360-bbb-3(b)(1), unless the authorization is terminated or revoked sooner.  Performed at [facility], [address] [place], [place] [address]   Blood Culture (routine x 2)     Status: None (Preliminary result)   Collection Time: 02/03/20 12:15 PM   Specimen: BLOOD  Result Value Ref Range Status   Specimen Description   Final    BLOOD LEFT ANTECUBITAL Performed at [facility], [address] [place], [place] [address]    Special Requests   Final    BOTTLES DRAWN AEROBIC AND ANAEROBIC Blood Culture adequate volume Performed at [facility], [address] [place], [place] [address]    Culture   Final    NO GROWTH 4 DAYS Performed at [facility], [place] [address] [place], [place] [address]    Report Status PENDING  Incomplete  Blood Culture (routine x 2)     Status: None (Preliminary result)   Collection Time: 02/03/20 12:20 PM   Specimen: BLOOD  Result Value Ref Range Status   Specimen Description   Final    BLOOD BLOOD LEFT HAND Performed at [facility], [address] [place], [place] [address]    Special Requests   Final    BOTTLES DRAWN AEROBIC AND ANAEROBIC Blood Culture adequate volume Performed at [facility], [address] [place], [place] [address]    Culture   Final    NO GROWTH 4 DAYS Performed at [facility], [place] [address] [place], [place] [address]    Report Status PENDING  Incomplete  MRSA PCR Screening     Status: None   Collection Time: 02/04/20  1:46 PM   Specimen: Nasopharyngeal  Result Value Ref Range Status   MRSA by PCR NEGATIVE NEGATIVE Final    Comment:        The GeneXpert MRSA Assay (FDA approved for NASAL specimens only), is one component of a comprehensive MRSA  colonization surveillance program. It is not intended to diagnose MRSA infection nor to guide or monitor treatment for MRSA infections. Performed at [facility], [place] [address] [place], [place] [address]      Radiology Studies: No results found.  Scheduled Meds: . vitamin C  500 mg Oral Daily  .  Chlorhexidine Gluconate Cloth  6 each Topical Daily  . enoxaparin (LOVENOX) injection  40 mg Subcutaneous Q24H  . famotidine  20 mg Oral Daily  . insulin aspart  0-15 Units Subcutaneous TID WC  . insulin aspart  0-5 Units Subcutaneous QHS  . linagliptin  5 mg Oral Daily  . methylPREDNISolone (SOLU-MEDROL) injection  40 mg Intravenous Q8H  . sodium chloride flush  3 mL Intravenous Q12H  . zinc sulfate  220 mg Oral Daily   Continuous Infusions: . sodium chloride Stopped (02/04/20 2116)     LOS: 3 days   Time spent: 35 minutes.  Little Ishikawa, DO [facility] [email or web address] 02/07/2020, 3:07 PM

## 2020-02-08 LAB — GLUCOSE, CAPILLARY
Glucose-Capillary: 134 mg/dL — ABNORMAL HIGH (ref 70–99)
Glucose-Capillary: 150 mg/dL — ABNORMAL HIGH (ref 70–99)
Glucose-Capillary: 132 mg/dL — ABNORMAL HIGH (ref 70–99)
Glucose-Capillary: 193 mg/dL — ABNORMAL HIGH (ref 70–99)

## 2020-02-08 LAB — CBC WITH DIFFERENTIAL/PLATELET
Abs Immature Granulocytes: 0.81 10*3/uL — ABNORMAL HIGH (ref 0.00–0.07)
Basophils Absolute: 0.1 10*3/uL (ref 0.0–0.1)
Basophils Relative: 1 %
Eosinophils Absolute: 0 10*3/uL (ref 0.0–0.5)
Eosinophils Relative: 0 %
HCT: 41.1 % (ref 39.0–52.0)
Hemoglobin: 13.6 g/dL (ref 13.0–17.0)
Immature Granulocytes: 7 %
Lymphocytes Relative: 9 %
Lymphs Abs: 1.1 10*3/uL (ref 0.7–4.0)
MCH: 29.4 pg (ref 26.0–34.0)
MCHC: 33.1 g/dL (ref 30.0–36.0)
MCV: 88.8 fL (ref 80.0–100.0)
Monocytes Absolute: 0.9 10*3/uL (ref 0.1–1.0)
Monocytes Relative: 7 %
Neutro Abs: 9.1 10*3/uL — ABNORMAL HIGH (ref 1.7–7.7)
Neutrophils Relative %: 76 %
Platelets: 494 10*3/uL — ABNORMAL HIGH (ref 150–400)
RBC: 4.63 MIL/uL (ref 4.22–5.81)
RDW: 13.7 % (ref 11.5–15.5)
WBC: 11.9 10*3/uL — ABNORMAL HIGH (ref 4.0–10.5)
nRBC: 2 % — ABNORMAL HIGH (ref 0.0–0.2)

## 2020-02-08 LAB — CULTURE, BLOOD (ROUTINE X 2)
Culture: NO GROWTH
Culture: NO GROWTH
Special Requests: ADEQUATE
Special Requests: ADEQUATE

## 2020-02-08 LAB — COMPREHENSIVE METABOLIC PANEL
ALT: 103 U/L — ABNORMAL HIGH (ref 0–44)
AST: 31 U/L (ref 15–41)
Albumin: 2.8 g/dL — ABNORMAL LOW (ref 3.5–5.0)
Alkaline Phosphatase: 152 U/L — ABNORMAL HIGH (ref 38–126)
Anion gap: 7 (ref 5–15)
BUN: 24 mg/dL — ABNORMAL HIGH (ref 6–20)
CO2: 23 mmol/L (ref 22–32)
Calcium: 7.4 mg/dL — ABNORMAL LOW (ref 8.9–10.3)
Chloride: 100 mmol/L (ref 98–111)
Creatinine, Ser: 0.79 mg/dL (ref 0.61–1.24)
GFR calc Af Amer: >60 mL/min (ref >60)
GFR calc non Af Amer: >60 mL/min (ref >60)
Glucose, Bld: 160 mg/dL — ABNORMAL HIGH (ref 70–99)
Potassium: 4.3 mmol/L (ref 3.5–5.1)
Sodium: 130 mmol/L — ABNORMAL LOW (ref 135–145)
Total Bilirubin: 0.3 mg/dL (ref 0.3–1.2)
Total Protein: 6.3 g/dL — ABNORMAL LOW (ref 6.5–8.1)

## 2020-02-08 LAB — D-DIMER, QUANTITATIVE: D-Dimer, Quant: 1.54 ug/mL-FEU — ABNORMAL HIGH (ref 0.00–0.50)

## 2020-02-08 LAB — C-REACTIVE PROTEIN: CRP: 1.4 mg/dL — ABNORMAL HIGH (ref <1.0)

## 2020-02-08 NOTE — Progress Notes (Signed)
PROGRESS NOTE  Victor Rich  NTZ:[id number] DOB: 10-14-1975 DOA: 02/03/2020 PCP: None per pt Brief Narrative: Victor Rich is a 44 y.o. male with no known medical history who presented to the ED 8/4 with shortness of breath that had worsened since 8/1, found to be hypoxemic requiring 4L O2 with positive SARS-CoV-2 PCR, elevated CRP at 18.8 with negative procalcitonin, WBC 6.1k, and CXR infiltrates consistent with covid-19 pneumonia. Remdesivir and steroids were started and he was admitted to [facility]. Hypoxemia worsened and CTA showed no PE but confirmed bilateral infiltrates. Tocilizumab was administered 8/5. He remains on 10-12L HFNC.   Assessment & Plan: Principal Problem:   Pneumonia due to COVID-19 virus Active Problems:   Acute respiratory failure with hypoxia (HCC)  Acute hypoxemic respiratory failure due to covid-19 pneumonia: SARS-CoV-2 PCR positive on 8/4, not vaccinated.  - Completed remdesivir 5-day course on 02/07/20 - Continue steroids x10 days (8/4 - 8/13)  - s/p tocilizumab 8/5 -labs trending downwards appropriately as below.  - Encourage OOB, IS, FV, and proning if able - Continue airborne, contact precautions for 21 days from positive testing. - Enoxaparin prophylactic dose. No PE on recent CTA. - Oxygen continues to be weaned slowly over the past few days SpO2: (!) 85 % O2 Flow Rate (L/min): 6 L/min Recent Labs    02/06/20 0236 02/07/20 0112 02/08/20 0305  DDIMER 1.11* 1.34* 1.54*  CRP 5.1* 2.9* 1.4*   Lab Results  Component Value Date   SARSCOV2NAA POSITIVE (A) 02/03/2020    Steroid-induced hyperglycemia, prediabetes: HbA1c 6.1%. - Carb-modified diet - Started linagliptin, moderate SSI - Moderately well controlled  Leukemoid reaction, mild - downtrending appropriately -In the setting of steroids as above, continue to follow  LFT elevation: Likely related to viral infection. - Monitor daily, upward trend but not contraindication to current therapies. Hepatic  Function Latest Ref Rng & Units 02/08/2020 02/07/2020 02/06/2020  Total Protein 6.5 - 8.1 g/dL 6.3(L) 6.5 6.9  Albumin 3.5 - 5.0 g/dL 2.8(L) 2.8(L) 2.9(L)  AST 15 - 41 U/L 31 73(H) 92(H)  ALT 0 - 44 U/L 103(H) 143(H) 97(H)  Alk Phosphatase 38 - 126 U/L 152(H) 169(H) 174(H)  Total Bilirubin 0.3 - 1.2 mg/dL 0.3 0.7 0.7   Lateral ST segment changes:  No active chest pain, no troponin elevation. Echocardiogram without LV or RV dysfunction or wall motion abnormalities.   DVT prophylaxis: Lovenox 34m q24h Code Status: Full Family Communication: Patient to update family himself Disposition Plan:  Status is: Inpatient  Remains inpatient appropriate because:Inpatient level of care appropriate due to severity of illness  Dispo: The patient is from: Home              Anticipated d/c is to: Home              Anticipated d/c date is: > 3 days              Patient currently is not medically stable to d/c.  Consultants:   None  Procedures:   None  Antimicrobials:  Remdesivir 8/4 - 8/8  Subjective: No acute issues or events overnight, patient's shortness of breath continues to improve daily - otherwise denies nausea, vomiting, diarrhea, constipation, headache, fevers, chills.  He does indicate ongoing worsening dyspnea with exertion but again improving from admission.  Objective: Vitals:   02/08/20 0600 02/08/20 0800 02/08/20 1000 02/08/20 1200  BP: 106/68 103/64 101/67   Pulse: (!) 54 (!) 50 72   Resp: 19 18 19    Temp:  98.7 F (37.1 C)  TempSrc:    Oral  SpO2: 93% 93% (!) 85%   Weight:      Height:        Intake/Output Summary (Last 24 hours) at 02/08/2020 1425 Last data filed at 02/08/2020 1023 Gross per 24 hour  Intake 0 ml  Output 1530 ml  Net -1530 ml   Filed Weights   02/03/20 1114 02/04/20 1330  Weight: 79.4 kg 73.5 kg   Gen: 44 y.o. male in no acute distress, resting comfortably in bedside chair Pulm: Mild tachypnea without accessory muscle use, crackles diffusely  without wheezes, stable. CV: Regular rate and rhythm. No murmur, rub, or gallop. No JVD, no dependent edema. GI: Abdomen soft, non-tender, non-distended, with normoactive bowel sounds.  Ext: Warm, no deformities Skin: No rashes, lesions or ulcers on visualized skin. Neuro: Alert and oriented. No focal neurological deficits. Psych: Judgement and insight appear fair. Mood euthymic & affect congruent. Behavior is appropriate.    Data Reviewed: I have personally reviewed following labs and imaging studies  CBC: Recent Labs  Lab 02/04/20 0919 02/05/20 0238 02/06/20 0236 02/07/20 0112 02/08/20 0305  WBC 6.1 8.8 12.3* 12.7* 11.9*  NEUTROABS 4.7 7.0 9.8* 10.1* 9.1*  HGB 12.9* 13.7 13.9 13.3 13.6  HCT 38.5* 41.9 42.2 40.6 41.1  MCV 87.3 89.1 89.8 90.2 88.8  PLT 323 411* 450* 490* 325*   Basic Metabolic Panel: Recent Labs  Lab 02/04/20 0919 02/05/20 0238 02/06/20 0236 02/07/20 0112 02/08/20 0305  NA 136 138 135 136 130*  K 4.1 4.8 4.8 4.6 4.3  CL 101 101 101 104 100  CO2 27 28 26 25 23   GLUCOSE 164* 182* 176* 159* 160*  BUN 16 24* 20 23* 24*  CREATININE 0.76 0.92 0.80 0.70 0.79  CALCIUM 8.3* 8.0* 7.7* 7.6* 7.4*  MG  --  2.6* 2.7*  --   --    GFR: Estimated Creatinine Clearance: 113.6 mL/min (by C-G formula based on SCr of 0.79 mg/dL). Liver Function Tests: Recent Labs  Lab 02/04/20 0919 02/05/20 0238 02/06/20 0236 02/07/20 0112 02/08/20 0305  AST 52* 49* 92* 73* 31  ALT 57* 64* 97* 143* 103*  ALKPHOS 192* 208* 174* 169* 152*  BILITOT 0.4 0.8 0.7 0.7 0.3  PROT 7.1 7.6 6.9 6.5 6.3*  ALBUMIN 3.4* 3.1* 2.9* 2.8* 2.8*   No results for input(s): LIPASE, AMYLASE in the last 168 hours. No results for input(s): AMMONIA in the last 168 hours. Coagulation Profile: No results for input(s): INR, PROTIME in the last 168 hours. Cardiac Enzymes: No results for input(s): CKTOTAL, CKMB, CKMBINDEX, TROPONINI in the last 168 hours. BNP (last 3 results) No results for input(s):  PROBNP in the last 8760 hours. HbA1C: No results for input(s): HGBA1C in the last 72 hours. CBG: Recent Labs  Lab 02/07/20 1212 02/07/20 1650 02/07/20 2203 02/08/20 0854 02/08/20 1234  GLUCAP 171* 200* 175* 134* 150*   Lipid Profile: No results for input(s): CHOL, HDL, LDLCALC, TRIG, CHOLHDL, LDLDIRECT in the last 72 hours. Thyroid Function Tests: No results for input(s): TSH, T4TOTAL, FREET4, T3FREE, THYROIDAB in the last 72 hours. Anemia Panel: No results for input(s): VITAMINB12, FOLATE, FERRITIN, TIBC, IRON, RETICCTPCT in the last 72 hours. Urine analysis: No results found for: COLORURINE, APPEARANCEUR, LABSPEC, PHURINE, GLUCOSEU, HGBUR, BILIRUBINUR, KETONESUR, PROTEINUR, UROBILINOGEN, NITRITE, LEUKOCYTESUR Recent Results (from the past 240 hour(s))  SARS Coronavirus 2 by RT PCR (hospital order, performed in [facility] hospital lab) Nasopharyngeal Nasopharyngeal Swab  Status: Abnormal   Collection Time: 02/03/20 12:02 PM   Specimen: Nasopharyngeal Swab  Result Value Ref Range Status   SARS Coronavirus 2 POSITIVE (A) NEGATIVE Final    Comment: RESULT CALLED TO, READ BACK BY AND VERIFIED WITH: MARVA SIMMS RN @1328  02/03/2020 OLSONM (NOTE) SARS-CoV-2 target nucleic acids are DETECTED  SARS-CoV-2 RNA is generally detectable in upper respiratory specimens  during the acute phase of infection.  Positive results are indicative  of the presence of the identified virus, but do not rule out bacterial infection or co-infection with other pathogens not detected by the test.  Clinical correlation with patient history and  other diagnostic information is necessary to determine patient infection status.  The expected result is negative.  Fact Sheet for Patients:   StrictlyIdeas.no   Fact Sheet for Healthcare Providers:   BankingDealers.co.za    This test is not yet approved or cleared by the [place] FDA and  has been  authorized for detection and/or diagnosis of SARS-CoV-2 by FDA under an Emergency Use Authorization (EUA).  This EUA will remain in effect (meaning thi s test can be used) for the duration of  the COVID-19 declaration under Section 564(b)(1) of the Act, 21 U.S.C. section 360-bbb-3(b)(1), unless the authorization is terminated or revoked sooner.  Performed at [facility], [address] [place], [place] [address]   Blood Culture (routine x 2)     Status: None   Collection Time: 02/03/20 12:15 PM   Specimen: BLOOD  Result Value Ref Range Status   Specimen Description   Final    BLOOD LEFT ANTECUBITAL Performed at [facility], [address] [place], [place] [address]    Special Requests   Final    BOTTLES DRAWN AEROBIC AND ANAEROBIC Blood Culture adequate volume Performed at [facility], [address] [place], [place] [address]    Culture   Final    NO GROWTH 5 DAYS Performed at [facility], [place] [address] [place], [place] [address]    Report Status 02/08/2020 FINAL  Final  Blood Culture (routine x 2)     Status: None   Collection Time: 02/03/20 12:20 PM   Specimen: BLOOD  Result Value Ref Range Status   Specimen Description   Final    BLOOD BLOOD LEFT HAND Performed at [facility], [address] [place], [place] [address]    Special Requests   Final    BOTTLES DRAWN AEROBIC AND ANAEROBIC Blood Culture adequate volume Performed at [facility], [address] [place], [place] [address]    Culture   Final    NO GROWTH 5 DAYS Performed at [facility], [place] [address] [place], [place] [address]    Report Status 02/08/2020 FINAL  Final  MRSA PCR Screening     Status: None   Collection Time: 02/04/20  1:46 PM   Specimen: Nasopharyngeal  Result Value Ref Range Status   MRSA by PCR NEGATIVE NEGATIVE Final    Comment:        The GeneXpert MRSA Assay (FDA approved for NASAL specimens only), is  one component of a comprehensive MRSA colonization surveillance program. It is not intended to diagnose MRSA infection nor to guide or monitor treatment for MRSA infections. Performed at [facility], [place] [address], [place], [place] [address]      Radiology Studies: No results found.  Scheduled Meds: .  vitamin C  500 mg Oral Daily  . Chlorhexidine Gluconate Cloth  6 each Topical Daily  . enoxaparin (LOVENOX) injection  40 mg Subcutaneous Q24H  . famotidine  20 mg Oral Daily  . insulin aspart  0-15 Units Subcutaneous TID WC  . insulin aspart  0-5 Units Subcutaneous QHS  . linagliptin  5 mg Oral Daily  . methylPREDNISolone (SOLU-MEDROL) injection  40 mg Intravenous Q8H  . sodium chloride flush  3 mL Intravenous Q12H  . zinc sulfate  220 mg Oral Daily   Continuous Infusions: . sodium chloride Stopped (02/04/20 2116)     LOS: 4 days   Time spent: 35 minutes.  Little Ishikawa, DO [facility] [email or web address] 02/08/2020, 2:25 PM

## 2020-02-09 LAB — CBC WITH DIFFERENTIAL/PLATELET
Abs Immature Granulocytes: 1.01 10*3/uL — ABNORMAL HIGH (ref 0.00–0.07)
Basophils Absolute: 0.1 10*3/uL (ref 0.0–0.1)
Basophils Relative: 1 %
Eosinophils Absolute: 0 10*3/uL (ref 0.0–0.5)
Eosinophils Relative: 0 %
HCT: 41.7 % (ref 39.0–52.0)
Hemoglobin: 13.8 g/dL (ref 13.0–17.0)
Immature Granulocytes: 8 %
Lymphocytes Relative: 9 %
Lymphs Abs: 1.2 10*3/uL (ref 0.7–4.0)
MCH: 29.4 pg (ref 26.0–34.0)
MCHC: 33.1 g/dL (ref 30.0–36.0)
MCV: 88.7 fL (ref 80.0–100.0)
Monocytes Absolute: 0.8 10*3/uL (ref 0.1–1.0)
Monocytes Relative: 6 %
Neutro Abs: 10.2 10*3/uL — ABNORMAL HIGH (ref 1.7–7.7)
Neutrophils Relative %: 76 %
Platelets: 495 10*3/uL — ABNORMAL HIGH (ref 150–400)
RBC: 4.7 MIL/uL (ref 4.22–5.81)
RDW: 13.6 % (ref 11.5–15.5)
WBC: 13.4 10*3/uL — ABNORMAL HIGH (ref 4.0–10.5)
nRBC: 2.2 % — ABNORMAL HIGH (ref 0.0–0.2)

## 2020-02-09 LAB — COMPREHENSIVE METABOLIC PANEL
ALT: 91 U/L — ABNORMAL HIGH (ref 0–44)
AST: 29 U/L (ref 15–41)
Albumin: 2.8 g/dL — ABNORMAL LOW (ref 3.5–5.0)
Alkaline Phosphatase: 141 U/L — ABNORMAL HIGH (ref 38–126)
Anion gap: 8 (ref 5–15)
BUN: 25 mg/dL — ABNORMAL HIGH (ref 6–20)
CO2: 26 mmol/L (ref 22–32)
Calcium: 7.9 mg/dL — ABNORMAL LOW (ref 8.9–10.3)
Chloride: 100 mmol/L (ref 98–111)
Creatinine, Ser: 0.83 mg/dL (ref 0.61–1.24)
GFR calc Af Amer: >60 mL/min (ref >60)
GFR calc non Af Amer: >60 mL/min (ref >60)
Glucose, Bld: 198 mg/dL — ABNORMAL HIGH (ref 70–99)
Potassium: 4.6 mmol/L (ref 3.5–5.1)
Sodium: 134 mmol/L — ABNORMAL LOW (ref 135–145)
Total Bilirubin: 0.6 mg/dL (ref 0.3–1.2)
Total Protein: 6 g/dL — ABNORMAL LOW (ref 6.5–8.1)

## 2020-02-09 LAB — GLUCOSE, CAPILLARY
Glucose-Capillary: 137 mg/dL — ABNORMAL HIGH (ref 70–99)
Glucose-Capillary: 144 mg/dL — ABNORMAL HIGH (ref 70–99)
Glucose-Capillary: 161 mg/dL — ABNORMAL HIGH (ref 70–99)
Glucose-Capillary: 120 mg/dL — ABNORMAL HIGH (ref 70–99)

## 2020-02-09 LAB — C-REACTIVE PROTEIN: CRP: 0.9 mg/dL (ref <1.0)

## 2020-02-09 LAB — D-DIMER, QUANTITATIVE: D-Dimer, Quant: 2.05 ug/mL-FEU — ABNORMAL HIGH (ref 0.00–0.50)

## 2020-02-09 MED ORDER — PREDNISONE 10 MG PO TABS
ORAL_TABLET | ORAL | 0 refills | Status: AC
Start: 2020-02-09 — End: 2020-02-21

## 2020-02-09 MED ORDER — GUAIFENESIN-DM 100-10 MG/5ML PO SYRP: 10.0000 mL | ORAL_SOLUTION | ORAL | 0 refills | Status: DC | PRN

## 2020-02-09 NOTE — Discharge Summary (Signed)
Physician Discharge Summary  Victor Rich ZOX:[id number] DOB: 07/30/1975 DOA: 02/03/2020  PCP: Patient, No Pcp Per  Admit date: 02/03/2020 Discharge date: 02/09/2020  Admitted From: Home Disposition: Home  Recommendations for Outpatient Follow-up:  1. Follow up with PCP in 1-2 weeks 2. Please obtain BMP/CBC in one week  Home Health: None Equipment/Devices: None  Discharge Condition: Stable CODE STATUS: Full Diet recommendation:    Brief/Interim Summary: Diabetic diet as tolerated Victor Rich is a 43 y.o. male with no known medical history who presented to the ED 8/4 with shortness of breath that had worsened since 8/1, found to be hypoxemic requiring 4L O2 with positive SARS-CoV-2 PCR, elevated CRP at 18.8 with negative procalcitonin, WBC 6.1k, and CXR infiltrates consistent with covid-19 pneumonia. Remdesivir and steroids were started and he was admitted to [facility].   Patient admitted as above with worsening hypoxia in the setting of acute Covid nineteen pneumonia. CTA showed no PE but confirmed bilateral infiltrates. Tocilizumab was administered 8/5. He remained on high flow nasal cannula for some days ultimately being able to wean down over the past 48 hours quite aggressively, patient now ambulating around the room without symptoms, requiring only 2 L nasal cannula to maintain sats in the 90s with exertion. At rest patient remains without overt hypoxia. At this time patient has completed Remdesivir, will continue remainder of steroid taper at home. Close follow-up with PCP in the next 1 to 2 weeks as scheduled via phone call or video chat would be sufficient to further discuss weaning of oxygen. Patient has had minimal hyperglycemia in the setting of IV steroids, we recommend strict diabetic diet over the next 2 weeks while weaning steroids at home. Otherwise patient agreeable and stable for discharge home. Patient has been educated on quarantining at home he will need quarantine for 3 weeks per  CDC guidelines from the day of his swab.  Discharge Diagnoses:  Principal Problem:   Pneumonia due to COVID-19 virus Active Problems:   Acute respiratory failure with hypoxia [facility])    Discharge Instructions  Discharge Instructions    Call MD for:  difficulty breathing, headache or visual disturbances   Complete by: As directed    Call MD for:  extreme fatigue   Complete by: As directed    Call MD for:  persistant dizziness or light-headedness   Complete by: As directed    Call MD for:  temperature >100.4   Complete by: As directed    Diet - low sodium heart healthy   Complete by: As directed    Increase activity slowly   Complete by: As directed    MyChart COVID-19 home monitoring program   Complete by: Feb 09, 2020    Is the patient willing to use the MyChart Mobile App for home monitoring?: No     Allergies as of 02/09/2020   No Known Allergies     Medication List    STOP taking these medications   ibuprofen 200 MG tablet Commonly known as: ADVIL   naproxen sodium 220 MG tablet Commonly known as: ALEVE     TAKE these medications   acetaminophen 500 MG tablet Commonly known as: TYLENOL Take 500 mg by mouth every 6 (six) hours as needed for moderate pain or fever.   aspirin EC 81 MG tablet Take 81 mg by mouth daily as needed for moderate pain. Swallow whole.   guaiFENesin-dextromethorphan 100-10 MG/5ML syrup Commonly known as: ROBITUSSIN DM Take 10 mLs by mouth every 4 (four) hours as  needed for cough.   NYQUIL PO Take 30 mLs by mouth daily as needed (cold symptoms).   predniSONE 10 MG tablet Commonly known as: DELTASONE Take 4 tablets (40 mg total) by mouth daily for 3 days, THEN 3 tablets (30 mg total) daily for 3 days, THEN 2 tablets (20 mg total) daily for 3 days, THEN 1 tablet (10 mg total) daily for 3 days. Start taking on: February 09, 2020       No Known Allergies  Consultations:  None   Procedures/Studies: CT ANGIO CHEST PE W OR WO  CONTRAST  Result Date: 02/04/2020 CLINICAL DATA:  Shortness of breath, cough, fever. COVID-19 positive EXAM: CT ANGIOGRAPHY CHEST WITH CONTRAST TECHNIQUE: Multidetector CT imaging of the chest was performed using the standard protocol during bolus administration of intravenous contrast. Multiplanar CT image reconstructions and MIPs were obtained to evaluate the vascular anatomy. CONTRAST:  OMNIPAQUE IOHEXOL 350 MG/ML SOLN COMPARISON:  Chest x-ray 02/04/2020, 02/03/2020 FINDINGS: Cardiovascular: Satisfactory opacification of the pulmonary arteries to the segmental level. No evidence of pulmonary embolism. Normal heart size. No pericardial effusion. Thoracic aorta is normal in course and caliber. Mediastinum/Nodes: No enlarged mediastinal, hilar, or axillary lymph nodes. Thyroid gland, trachea, and esophagus demonstrate no significant findings. Lungs/Pleura: Dense airspace consolidations with air bronchograms within the medial aspects of the bilateral lung bases. Multifocal areas of peripheral ground-glass opacity throughout both lung fields, most pronounced within the inferior aspect of the right upper lobe. No pleural effusion or pneumothorax. Upper Abdomen: Cholelithiasis. Musculoskeletal: No chest wall abnormality. No acute or significant osseous findings. Review of the MIP images confirms the above findings. IMPRESSION: 1. No evidence of pulmonary embolism. 2. Dense airspace consolidations with air bronchograms within the medial aspects of the bilateral lung bases, with additional multifocal areas of peripheral ground-glass opacity throughout both lung fields. Findings are most consistent with multifocal pneumonia in the setting of known COVID-19 infection. 3. Cholelithiasis. Electronically Signed   By: Duanne Guess D.O.   On: 02/04/2020 16:51   DG CHEST PORT 1 VIEW  Result Date: 02/04/2020 CLINICAL DATA:  Eval for infection. EXAM: PORTABLE CHEST 1 VIEW COMPARISON:  02/03/2020 chest radiograph.  FINDINGS: Hypoinflated lungs. Bilateral perihilar and bibasilar patchy opacities are increased. No pneumothorax. Trace left pleural effusion, unchanged. Cardiomediastinal silhouette is partially obscured. Osseous structures are unchanged. IMPRESSION: Mildly increased bilateral perihilar/bibasilar opacities are concerning for infection. Trace left pleural effusion, unchanged. Electronically Signed   By: Stana Bunting M.D.   On: 02/04/2020 15:05   DG Chest Port 1 View  Result Date: 02/03/2020 CLINICAL DATA:  Cough, shortness of breath, fever EXAM: PORTABLE CHEST 1 VIEW COMPARISON:  None. FINDINGS: The heart size and mediastinal contours are within normal limits. Low lung volumes. Hazy interstitial opacities within the peripheral aspects of the bilateral lung bases, left slightly worse than right. No appreciable pleural fluid collection. No pneumothorax. The visualized skeletal structures are unremarkable. IMPRESSION: Hazy interstitial opacities within the peripheral aspects of the bilateral lung bases, left slightly worse than right. Findings suspicious for atypical/viral infection. Electronically Signed   By: Duanne Guess D.O.   On: 02/03/2020 12:26   ECHOCARDIOGRAM COMPLETE  Result Date: 02/05/2020    ECHOCARDIOGRAM REPORT   Patient Name:   MARICE ANGELINO Date of Exam: 02/05/2020 Medical Rec #:  [id number]     Height:       67.0 in Accession #:    [id number]    Weight:       162.0 lb Date of  Birth:  [birth year]     BSA:          1.849 m Patient Age:    43 years      BP:           112/71 mmHg Patient Gender: M             HR:           81 bpm. Exam Location:  Inpatient Procedure: 2D Echo Indications:    794.31 abnormal ECG  History:        Patient has no prior history of Echocardiogram examinations.                 Covid + . no prior cardiac hx on file.  Sonographer:    Celene Skeen RDCS (AE) Referring Phys: [id number] Osvaldo Shipper IMPRESSIONS  1. Left ventricular ejection fraction, by estimation, is 60 to  65%. The left ventricle has normal function. The left ventricle has no regional wall motion abnormalities. Left ventricular diastolic parameters were normal.  2. Right ventricular systolic function is normal. The right ventricular size is normal.  3. The mitral valve is normal in structure. Trivial mitral valve regurgitation. No evidence of mitral stenosis.  4. The aortic valve is tricuspid. Aortic valve regurgitation is not visualized. No aortic stenosis is present. FINDINGS  Left Ventricle: Left ventricular ejection fraction, by estimation, is 60 to 65%. The left ventricle has normal function. The left ventricle has no regional wall motion abnormalities. The left ventricular internal cavity size was normal in size. There is  no left ventricular hypertrophy. Left ventricular diastolic parameters were normal. Right Ventricle: The right ventricular size is normal.Right ventricular systolic function is normal. Left Atrium: Left atrial size was normal in size. Right Atrium: Right atrial size was normal in size. Pericardium: There is no evidence of pericardial effusion. Mitral Valve: The mitral valve is normal in structure. Normal mobility of the mitral valve leaflets. Trivial mitral valve regurgitation. No evidence of mitral valve stenosis. Tricuspid Valve: The tricuspid valve is normal in structure. Tricuspid valve regurgitation is trivial. No evidence of tricuspid stenosis. Aortic Valve: The aortic valve is tricuspid. Aortic valve regurgitation is not visualized. No aortic stenosis is present. Pulmonic Valve: The pulmonic valve was normal in structure. Pulmonic valve regurgitation is trivial. No evidence of pulmonic stenosis. Aorta: The aortic root is normal in size and structure. Venous: The inferior vena cava was not well visualized.   LEFT VENTRICLE PLAX 2D LVIDd:         5.20 cm  Diastology LVIDs:         3.05 cm  LV e' lateral:   14.80 cm/s LV PW:         1.00 cm  LV E/e' lateral: 5.4 LV IVS:        0.80 cm  LV  e' medial:    10.00 cm/s LVOT diam:     2.00 cm  LV E/e' medial:  8.1 LV SV:         71 LV SV Index:   38 LVOT Area:     3.14 cm  RIGHT VENTRICLE RV S prime:     14.80 cm/s TAPSE (M-mode): 1.7 cm LEFT ATRIUM             Index LA diam:        3.80 cm 2.05 cm/m LA Vol (A2C):   28.2 ml 15.25 ml/m LA Vol (A4C):   43.7 ml 23.63 ml/m LA Biplane Vol: 35.8  ml 19.36 ml/m  AORTIC VALVE LVOT Vmax:   108.00 cm/s LVOT Vmean:  75.300 cm/s LVOT VTI:    0.226 m  AORTA Ao Root diam: 3.10 cm MITRAL VALVE MV Area (PHT): 3.19 cm    SHUNTS MV Decel Time: 238 msec    Systemic VTI:  0.23 m MV E velocity: 80.60 cm/s  Systemic Diam: 2.00 cm MV A velocity: 66.40 cm/s MV E/A ratio:  1.21 Olga MillersBrian Crenshaw MD Electronically signed by Olga MillersBrian Crenshaw MD Signature Date/Time: 02/05/2020/2:57:48 PM    Final       Subjective: No acute issues or events overnight, denies shortness of breath, chest pain, nausea, vomiting, diarrhea, constipation, headache, fevers, chills.   Discharge Exam: Vitals:   02/09/20 1000 02/09/20 1200  BP: 102/60   Pulse: (!) 50   Resp: 18   Temp:  98.1 F (36.7 C)  SpO2: 93%    Vitals:   02/09/20 0800 02/09/20 0900 02/09/20 1000 02/09/20 1200  BP: (!) 103/59  102/60   Pulse: (!) 53 (!) 55 (!) 50   Resp: 17 (!) 22 18   Temp: 97.6 F (36.4 C)   98.1 F (36.7 C)  TempSrc: Oral   Oral  SpO2: 93% (!) 89% 93%   Weight:      Height:        General: Pt is alert, awake, not in acute distress Cardiovascular: RRR, S1/S2 +, no rubs, no gallops Respiratory: CTA bilaterally, no wheezing, no rhonchi Abdominal: Soft, NT, ND, bowel sounds + Extremities: no edema, no cyanosis    The results of significant diagnostics from this hospitalization (including imaging, microbiology, ancillary and laboratory) are listed below for reference.     Microbiology: Recent Results (from the past 240 hour(s))  SARS Coronavirus 2 by RT PCR (hospital order, performed [facility]) Nasopharyngeal  Nasopharyngeal Swab     Status: Abnormal   Collection Time: 02/03/20 12:02 PM   Specimen: Nasopharyngeal Swab  Result Value Ref Range Status   SARS Coronavirus 2 POSITIVE (A) NEGATIVE Final    Comment: RESULT CALLED TO, READ BACK BY AND VERIFIED WITH: MARVA SIMMS RN @1328  02/03/2020 OLSONM (NOTE) SARS-CoV-2 target nucleic acids are DETECTED  SARS-CoV-2 RNA is generally detectable in upper respiratory specimens  during the acute phase of infection.  Positive results are indicative  of the presence of the identified virus, but do not rule out bacterial infection or co-infection with other pathogens not detected by the test.  Clinical correlation with patient history and  other diagnostic information is necessary to determine patient infection status.  The expected result is negative.  Fact Sheet for Patients:   BoilerBrush.com.cyhttps://www.fda.gov/media/136312/download   Fact Sheet for Healthcare Providers:   https://pope.com/https://www.fda.gov/media/136313/download    This test is not yet approved or cleared by the [place] FDA and  has been authorized for detection and/or diagnosis of SARS-CoV-2 by FDA under an Emergency Use Authorization (EUA).  This EUA will remain in effect (meaning thi s test can be used) for the duration of  the COVID-19 declaration under Section 564(b)(1) of the Act, 21 U.S.C. section 360-bbb-3(b)(1), unless the authorization is terminated or revoked sooner.  Performed at [facility] [place], [address] [place], [place] [id number]   Blood Culture (routine x 2)     Status: None   Collection Time: 02/03/20 12:15 PM   Specimen: BLOOD  Result Value Ref Range Status   Specimen Description   Final    BLOOD LEFT ANTECUBITAL Performed  at [facility], [address] [place], [place] [address]    Special Requests   Final    BOTTLES DRAWN AEROBIC AND ANAEROBIC Blood Culture adequate volume Performed at [facility], [address] [place], [place]  [address]    Culture   Final    NO GROWTH 5 DAYS Performed at [facility], [address] [place], [place] [address]    Report Status 02/08/2020 FINAL  Final  Blood Culture (routine x 2)     Status: None   Collection Time: 02/03/20 12:20 PM   Specimen: BLOOD  Result Value Ref Range Status   Specimen Description   Final    BLOOD BLOOD LEFT HAND Performed at [facility], [address] [place], [place] [address]    Special Requests   Final    BOTTLES DRAWN AEROBIC AND ANAEROBIC Blood Culture adequate volume Performed at [facility], [address] [place], [place] [address]    Culture   Final    NO GROWTH 5 DAYS Performed at [facility], [address], [place], [place] [address]    Report Status 02/08/2020 FINAL  Final  MRSA PCR Screening     Status: None   Collection Time: 02/04/20  1:46 PM   Specimen: Nasopharyngeal  Result Value Ref Range Status   MRSA by PCR NEGATIVE NEGATIVE Final    Comment:        The GeneXpert MRSA Assay (FDA approved for NASAL specimens only), is one component of a comprehensive MRSA colonization surveillance program. It is not intended to diagnose MRSA infection nor to guide or monitor treatment for MRSA infections. Performed at [facility], [address], [place], [place] [address]      Labs: BNP (last 3 results) No results for input(s): BNP in the last 8760 hours. Basic Metabolic Panel: Recent Labs  Lab 02/05/20 0238 02/06/20 0236 02/07/20 0112 02/08/20 0305 02/09/20 0248  NA 138 135 136 130* 134*  K 4.8 4.8 4.6 4.3 4.6  CL 101 101 104 100 100  CO2 28 26 25 23 26   GLUCOSE 182* 176* 159* 160* 198*  BUN 24* 20 23* 24* 25*  CREATININE 0.92 0.80 0.70 0.79 0.83  CALCIUM 8.0* 7.7* 7.6* 7.4* 7.9*  MG 2.6* 2.7*  --   --   --    Liver Function Tests: Recent Labs  Lab 02/05/20 0238 02/06/20 0236 02/07/20 0112 02/08/20 0305 02/09/20 0248  AST 49* 92* 73* 31 29  ALT 64*  97* 143* 103* 91*  ALKPHOS 208* 174* 169* 152* 141*  BILITOT 0.8 0.7 0.7 0.3 0.6  PROT 7.6 6.9 6.5 6.3* 6.0*  ALBUMIN 3.1* 2.9* 2.8* 2.8* 2.8*   No results for input(s): LIPASE, AMYLASE in the last 168 hours. No results for input(s): AMMONIA in the last 168 hours. CBC: Recent Labs  Lab 02/05/20 0238 02/06/20 0236 02/07/20 0112 02/08/20 0305 02/09/20 0248  WBC 8.8 12.3* 12.7* 11.9* 13.4*  NEUTROABS 7.0 9.8* 10.1* 9.1* 10.2*  HGB 13.7 13.9 13.3 13.6 13.8  HCT 41.9 42.2 40.6 41.1 41.7  MCV 89.1 89.8 90.2 88.8 88.7  PLT 411* 450* 490* 494* 495*   Cardiac Enzymes: No results for input(s): CKTOTAL, CKMB, CKMBINDEX, TROPONINI in the last 168 hours. BNP: Invalid input(s): POCBNP CBG: Recent Labs  Lab 02/08/20 1234 02/08/20 1735 02/08/20 2204 02/09/20 0906 02/09/20 1304  GLUCAP 150* 132* 193* 137* 144*  D-Dimer Recent Labs    02/08/20 0305 02/09/20 0248  DDIMER 1.54* 2.05*   Hgb A1c No results for input(s): HGBA1C in the last 72 hours. Lipid Profile No results for input(s): CHOL, HDL, LDLCALC, TRIG, CHOLHDL, LDLDIRECT in the last 72 hours. Thyroid function studies No results for input(s): TSH, T4TOTAL, T3FREE, THYROIDAB in the last 72 hours.  Invalid input(s): FREET3 Anemia work up No results for input(s): VITAMINB12, FOLATE, FERRITIN, TIBC, IRON, RETICCTPCT in the last 72 hours. Urinalysis No results found for: COLORURINE, APPEARANCEUR, LABSPEC, PHURINE, GLUCOSEU, HGBUR, BILIRUBINUR, KETONESUR, PROTEINUR, UROBILINOGEN, NITRITE, LEUKOCYTESUR Sepsis Labs Invalid input(s): PROCALCITONIN,  WBC,  LACTICIDVEN Microbiology Recent Results (from the past 240 hour(s))  SARS Coronavirus 2 by RT PCR (hospital order, performed in [facility] hospital lab) Nasopharyngeal Nasopharyngeal Swab     Status: Abnormal   Collection Time: 02/03/20 12:02 PM   Specimen: Nasopharyngeal Swab  Result Value Ref Range Status   SARS Coronavirus 2 POSITIVE (A) NEGATIVE Final    Comment:  RESULT CALLED TO, READ BACK BY AND VERIFIED WITH: MARVA SIMMS RN @1328  02/03/2020 OLSONM (NOTE) SARS-CoV-2 target nucleic acids are DETECTED  SARS-CoV-2 RNA is generally detectable in upper respiratory specimens  during the acute phase of infection.  Positive results are indicative  of the presence of the identified virus, but do not rule out bacterial infection or co-infection with other pathogens not detected by the test.  Clinical correlation with patient history and  other diagnostic information is necessary to determine patient infection status.  The expected result is negative.  Fact Sheet for Patients:   04/04/2020   Fact Sheet for Healthcare Providers:   BoilerBrush.com.cy    This test is not yet approved or cleared by the https://pope.com/ FDA and  has been authorized for detection and/or diagnosis of SARS-CoV-2 by FDA under an Emergency Use Authorization (EUA).  This EUA will remain in effect (meaning thi s test can be used) for the duration of  the COVID-19 declaration under Section 564(b)(1) of the Act, 21 U.S.C. section 360-bbb-3(b)(1), unless the authorization is terminated or revoked sooner.  Performed at [facility], [address] [place], [place]   Blood Culture (routine x 2)     Status: None   Collection Time: 02/03/20 12:15 PM   Specimen: BLOOD  Result Value Ref Range Status   Specimen Description   Final    BLOOD LEFT ANTECUBITAL Performed at [facility], [address] [place], [place]    Special Requests   Final    BOTTLES DRAWN AEROBIC AND ANAEROBIC Blood Culture adequate volume Performed at [facility], [address] [place], [place]    Culture   Final    NO GROWTH 5 DAYS Performed at [facility], [address], [place], [place]    Report Status 02/08/2020 FINAL  Final  Blood Culture (routine x 2)     Status:  None   Collection Time: 02/03/20 12:20 PM   Specimen: BLOOD  Result Value Ref Range Status   Specimen Description   Final    BLOOD BLOOD LEFT HAND Performed at [facility], [address] [place], [place]    Special Requests   Final    BOTTLES DRAWN AEROBIC AND ANAEROBIC Blood Culture adequate volume Performed at [facility], [address], [place], [place]    Culture   Final  NO GROWTH 5 DAYS Performed at [facility], [address] [place], [place] [address]    Report Status 02/08/2020 FINAL  Final  MRSA PCR Screening     Status: None   Collection Time: 02/04/20  1:46 PM   Specimen: Nasopharyngeal  Result Value Ref Range Status   MRSA by PCR NEGATIVE NEGATIVE Final    Comment:        The GeneXpert MRSA Assay (FDA approved for NASAL specimens only), is one component of a comprehensive MRSA colonization surveillance program. It is not intended to diagnose MRSA infection nor to guide or monitor treatment for MRSA infections. Performed at [facility], [address], [place], [place] [address]      Time coordinating discharge: Over 30 minutes  SIGNED:   Azucena Fallen, DO [facility] 02/09/2020, 4:05 PM Pager   If 7PM-7AM, please contact night-coverage [email or web address]

## 2020-02-09 NOTE — Plan of Care (Signed)

## 2020-02-09 NOTE — Progress Notes (Signed)
SATURATION QUALIFICATIONS: (This note is used to comply with regulatory documentation for home oxygen)  Patient Saturations on Room Air at Rest = 86%  Patient Saturations on Room Air while Ambulating =82%  Patient Saturations on 4 Liters of oxygen while Ambulating = 91%  Please briefly explain why patient needs home oxygen: Patient unable to maintain adequate oxygen saturation levels without the aid of supplemental oxygen.  Patients supplemental oxygen dependency increases with ADL's and conversating

## 2020-02-10 LAB — CBC WITH DIFFERENTIAL/PLATELET
Abs Immature Granulocytes: 0.89 10*3/uL — ABNORMAL HIGH (ref 0.00–0.07)
Basophils Absolute: 0.1 10*3/uL (ref 0.0–0.1)
Basophils Relative: 1 %
Eosinophils Absolute: 0 10*3/uL (ref 0.0–0.5)
Eosinophils Relative: 0 %
HCT: 40.3 % (ref 39.0–52.0)
Hemoglobin: 13.4 g/dL (ref 13.0–17.0)
Immature Granulocytes: 7 %
Lymphocytes Relative: 11 %
Lymphs Abs: 1.3 10*3/uL (ref 0.7–4.0)
MCH: 29.7 pg (ref 26.0–34.0)
MCHC: 33.3 g/dL (ref 30.0–36.0)
MCV: 89.4 fL (ref 80.0–100.0)
Monocytes Absolute: 0.9 10*3/uL (ref 0.1–1.0)
Monocytes Relative: 8 %
Neutro Abs: 8.9 10*3/uL — ABNORMAL HIGH (ref 1.7–7.7)
Neutrophils Relative %: 73 %
Platelets: 459 10*3/uL — ABNORMAL HIGH (ref 150–400)
RBC: 4.51 MIL/uL (ref 4.22–5.81)
RDW: 13.6 % (ref 11.5–15.5)
WBC: 12.1 10*3/uL — ABNORMAL HIGH (ref 4.0–10.5)
nRBC: 1.6 % — ABNORMAL HIGH (ref 0.0–0.2)

## 2020-02-10 LAB — COMPREHENSIVE METABOLIC PANEL
ALT: 81 U/L — ABNORMAL HIGH (ref 0–44)
AST: 29 U/L (ref 15–41)
Albumin: 2.8 g/dL — ABNORMAL LOW (ref 3.5–5.0)
Alkaline Phosphatase: 140 U/L — ABNORMAL HIGH (ref 38–126)
Anion gap: 7 (ref 5–15)
BUN: 30 mg/dL — ABNORMAL HIGH (ref 6–20)
CO2: 27 mmol/L (ref 22–32)
Calcium: 7.8 mg/dL — ABNORMAL LOW (ref 8.9–10.3)
Chloride: 101 mmol/L (ref 98–111)
Creatinine, Ser: 0.92 mg/dL (ref 0.61–1.24)
GFR calc Af Amer: >60 mL/min (ref >60)
GFR calc non Af Amer: >60 mL/min (ref >60)
Glucose, Bld: 149 mg/dL — ABNORMAL HIGH (ref 70–99)
Potassium: 4.4 mmol/L (ref 3.5–5.1)
Sodium: 135 mmol/L (ref 135–145)
Total Bilirubin: 0.8 mg/dL (ref 0.3–1.2)
Total Protein: 5.9 g/dL — ABNORMAL LOW (ref 6.5–8.1)

## 2020-02-10 LAB — GLUCOSE, CAPILLARY
Glucose-Capillary: 115 mg/dL — ABNORMAL HIGH (ref 70–99)
Glucose-Capillary: 124 mg/dL — ABNORMAL HIGH (ref 70–99)

## 2020-02-10 LAB — C-REACTIVE PROTEIN: CRP: 0.8 mg/dL (ref <1.0)

## 2020-02-10 LAB — D-DIMER, QUANTITATIVE: D-Dimer, Quant: 3 ug/mL-FEU — ABNORMAL HIGH (ref 0.00–0.50)

## 2020-02-10 NOTE — Discharge Summary (Signed)
Physician Discharge Summary  Victor Rich RUE:[id number] DOB: April 01, 1976 DOA: 02/03/2020  PCP: Patient, No Pcp Per  Admit date: 02/03/2020 Discharge date: 02/10/2020  Admitted From: Home Disposition: Home  Recommendations for Outpatient Follow-up:  1. Follow up with PCP in 1-2 weeks 2. Please obtain BMP/CBC in one week  Home Health: None Equipment/Devices: None  Discharge Condition: Stable CODE STATUS: Full Diet recommendation:    Brief/Interim Summary: Diabetic diet as tolerated Victor Rich is a 44 y.o. male with no known medical history who presented to the ED 8/4 with shortness of breath that had worsened since 8/1, found to be hypoxemic requiring 4L O2 with positive SARS-CoV-2 PCR, elevated CRP at 18.8 with negative procalcitonin, WBC 6.1k, and CXR infiltrates consistent with covid-19 pneumonia. Remdesivir and steroids were started and he was admitted to [facility].   Patient admitted as above with worsening hypoxia in the setting of acute Covid nineteen pneumonia. CTA showed no PE but confirmed bilateral infiltrates. Tocilizumab was administered 8/5. He remained on high flow nasal cannula for some days ultimately being able to wean down over the past 48 hours quite aggressively, patient now ambulating around the room without symptoms, requiring only 2 L nasal cannula to maintain sats in the 90s with exertion. At rest patient remains without overt hypoxia. At this time patient has completed Remdesivir, will continue remainder of steroid taper at home. Close follow-up with PCP in the next 1 to 2 weeks as scheduled via phone call or video chat would be sufficient to further discuss weaning of oxygen. Patient has had minimal hyperglycemia in the setting of IV steroids, we recommend strict diabetic diet over the next 2 weeks while weaning steroids at home. Otherwise patient agreeable and stable for discharge home. Patient has been educated on quarantining at home he will need quarantine for 3 weeks per  CDC guidelines from the day of his swab.  He remained in the hospital unfortunately overnight due to difficulty obtaining oxygen at home for safe disposition, awaiting oxygen delivery today, once this has been delivered patient will otherwise be stable and agreeable for discharge home.  Discharge Diagnoses:  Principal Problem:   Pneumonia due to COVID-19 virus Active Problems:   Acute respiratory failure with hypoxia [facility])    Discharge Instructions  Discharge Instructions    MyChart COVID-19 home monitoring program   Complete by: Feb 09, 2020    Is the patient willing to use the MyChart Mobile App for home monitoring?: No   Call MD for:  difficulty breathing, headache or visual disturbances   Complete by: As directed    Call MD for:  extreme fatigue   Complete by: As directed    Call MD for:  persistant dizziness or light-headedness   Complete by: As directed    Call MD for:  temperature >100.4   Complete by: As directed    Diet - low sodium heart healthy   Complete by: As directed    Increase activity slowly   Complete by: As directed      Allergies as of 02/10/2020   No Known Allergies     Medication List    STOP taking these medications   ibuprofen 200 MG tablet Commonly known as: ADVIL   naproxen sodium 220 MG tablet Commonly known as: ALEVE     TAKE these medications   acetaminophen 500 MG tablet Commonly known as: TYLENOL Take 500 mg by mouth every 6 (six) hours as needed for moderate pain or fever.   aspirin EC  81 MG tablet Take 81 mg by mouth daily as needed for moderate pain. Swallow whole.   guaiFENesin-dextromethorphan 100-10 MG/5ML syrup Commonly known as: ROBITUSSIN DM Take 10 mLs by mouth every 4 (four) hours as needed for cough.   NYQUIL PO Take 30 mLs by mouth daily as needed (cold symptoms).   predniSONE 10 MG tablet Commonly known as: DELTASONE Take 4 tablets (40 mg total) by mouth daily for 3 days, THEN 3 tablets (30 mg total) daily for 3  days, THEN 2 tablets (20 mg total) daily for 3 days, THEN 1 tablet (10 mg total) daily for 3 days. Start taking on: February 09, 2020            Durable Medical Equipment  (From admission, onward)         Start     Ordered   02/10/20 0704  DME Oxygen  Once       Question Answer Comment  Length of Need 6 Months   Mode or (Route) Nasal cannula   Liters per Minute 4   Frequency Continuous (stationary and portable oxygen unit needed)   Oxygen conserving device No   Oxygen delivery system Gas      02/10/20 0703          No Known Allergies  Consultations:  None   Procedures/Studies: CT ANGIO CHEST PE W OR WO CONTRAST  Result Date: 02/04/2020 CLINICAL DATA:  Shortness of breath, cough, fever. COVID-19 positive EXAM: CT ANGIOGRAPHY CHEST WITH CONTRAST TECHNIQUE: Multidetector CT imaging of the chest was performed using the standard protocol during bolus administration of intravenous contrast. Multiplanar CT image reconstructions and MIPs were obtained to evaluate the vascular anatomy. CONTRAST:  OMNIPAQUE IOHEXOL 350 MG/ML SOLN COMPARISON:  Chest x-ray 02/04/2020, 02/03/2020 FINDINGS: Cardiovascular: Satisfactory opacification of the pulmonary arteries to the segmental level. No evidence of pulmonary embolism. Normal heart size. No pericardial effusion. Thoracic aorta is normal in course and caliber. Mediastinum/Nodes: No enlarged mediastinal, hilar, or axillary lymph nodes. Thyroid gland, trachea, and esophagus demonstrate no significant findings. Lungs/Pleura: Dense airspace consolidations with air bronchograms within the medial aspects of the bilateral lung bases. Multifocal areas of peripheral ground-glass opacity throughout both lung fields, most pronounced within the inferior aspect of the right upper lobe. No pleural effusion or pneumothorax. Upper Abdomen: Cholelithiasis. Musculoskeletal: No chest wall abnormality. No acute or significant osseous findings. Review of the MIP  images confirms the above findings. IMPRESSION: 1. No evidence of pulmonary embolism. 2. Dense airspace consolidations with air bronchograms within the medial aspects of the bilateral lung bases, with additional multifocal areas of peripheral ground-glass opacity throughout both lung fields. Findings are most consistent with multifocal pneumonia in the setting of known COVID-19 infection. 3. Cholelithiasis. Electronically Signed   By: Duanne Guess D.O.   On: 02/04/2020 16:51   DG CHEST PORT 1 VIEW  Result Date: 02/04/2020 CLINICAL DATA:  Eval for infection. EXAM: PORTABLE CHEST 1 VIEW COMPARISON:  02/03/2020 chest radiograph. FINDINGS: Hypoinflated lungs. Bilateral perihilar and bibasilar patchy opacities are increased. No pneumothorax. Trace left pleural effusion, unchanged. Cardiomediastinal silhouette is partially obscured. Osseous structures are unchanged. IMPRESSION: Mildly increased bilateral perihilar/bibasilar opacities are concerning for infection. Trace left pleural effusion, unchanged. Electronically Signed   By: Stana Bunting M.D.   On: 02/04/2020 15:05   DG Chest Port 1 View  Result Date: 02/03/2020 CLINICAL DATA:  Cough, shortness of breath, fever EXAM: PORTABLE CHEST 1 VIEW COMPARISON:  None. FINDINGS: The heart size and mediastinal  contours are within normal limits. Low lung volumes. Hazy interstitial opacities within the peripheral aspects of the bilateral lung bases, left slightly worse than right. No appreciable pleural fluid collection. No pneumothorax. The visualized skeletal structures are unremarkable. IMPRESSION: Hazy interstitial opacities within the peripheral aspects of the bilateral lung bases, left slightly worse than right. Findings suspicious for atypical/viral infection. Electronically Signed   By: Duanne Guess D.O.   On: 02/03/2020 12:26   ECHOCARDIOGRAM COMPLETE  Result Date: 02/05/2020    ECHOCARDIOGRAM REPORT   Patient Name:   JEHU MCCAUSLIN Date of Exam:  02/05/2020 Medical Rec #:  [id number]     Height:       67.0 in Accession #:    [id number]    Weight:       162.0 lb Date of Birth:  [birth year]     BSA:          1.849 m Patient Age:    43 years      BP:           112/71 mmHg Patient Gender: M             HR:           81 bpm. Exam Location:  Inpatient Procedure: 2D Echo Indications:    794.31 abnormal ECG  History:        Patient has no prior history of Echocardiogram examinations.                 Covid + . no prior cardiac hx on file.  Sonographer:    Celene Skeen RDCS (AE) Referring Phys: [id number] Osvaldo Shipper IMPRESSIONS  1. Left ventricular ejection fraction, by estimation, is 60 to 65%. The left ventricle has normal function. The left ventricle has no regional wall motion abnormalities. Left ventricular diastolic parameters were normal.  2. Right ventricular systolic function is normal. The right ventricular size is normal.  3. The mitral valve is normal in structure. Trivial mitral valve regurgitation. No evidence of mitral stenosis.  4. The aortic valve is tricuspid. Aortic valve regurgitation is not visualized. No aortic stenosis is present. FINDINGS  Left Ventricle: Left ventricular ejection fraction, by estimation, is 60 to 65%. The left ventricle has normal function. The left ventricle has no regional wall motion abnormalities. The left ventricular internal cavity size was normal in size. There is  no left ventricular hypertrophy. Left ventricular diastolic parameters were normal. Right Ventricle: The right ventricular size is normal.Right ventricular systolic function is normal. Left Atrium: Left atrial size was normal in size. Right Atrium: Right atrial size was normal in size. Pericardium: There is no evidence of pericardial effusion. Mitral Valve: The mitral valve is normal in structure. Normal mobility of the mitral valve leaflets. Trivial mitral valve regurgitation. No evidence of mitral valve stenosis. Tricuspid Valve: The tricuspid valve is normal in  structure. Tricuspid valve regurgitation is trivial. No evidence of tricuspid stenosis. Aortic Valve: The aortic valve is tricuspid. Aortic valve regurgitation is not visualized. No aortic stenosis is present. Pulmonic Valve: The pulmonic valve was normal in structure. Pulmonic valve regurgitation is trivial. No evidence of pulmonic stenosis. Aorta: The aortic root is normal in size and structure. Venous: The inferior vena cava was not well visualized.   LEFT VENTRICLE PLAX 2D LVIDd:         5.20 cm  Diastology LVIDs:         3.05 cm  LV e' lateral:   14.80 cm/s LV PW:  1.00 cm  LV E/e' lateral: 5.4 LV IVS:        0.80 cm  LV e' medial:    10.00 cm/s LVOT diam:     2.00 cm  LV E/e' medial:  8.1 LV SV:         71 LV SV Index:   38 LVOT Area:     3.14 cm  RIGHT VENTRICLE RV S prime:     14.80 cm/s TAPSE (M-mode): 1.7 cm LEFT ATRIUM             Index LA diam:        3.80 cm 2.05 cm/m LA Vol (A2C):   28.2 ml 15.25 ml/m LA Vol (A4C):   43.7 ml 23.63 ml/m LA Biplane Vol: 35.8 ml 19.36 ml/m  AORTIC VALVE LVOT Vmax:   108.00 cm/s LVOT Vmean:  75.300 cm/s LVOT VTI:    0.226 m  AORTA Ao Root diam: 3.10 cm MITRAL VALVE MV Area (PHT): 3.19 cm    SHUNTS MV Decel Time: 238 msec    Systemic VTI:  0.23 m MV E velocity: 80.60 cm/s  Systemic Diam: 2.00 cm MV A velocity: 66.40 cm/s MV E/A ratio:  1.21 Olga MillersBrian Crenshaw MD Electronically signed by Olga MillersBrian Crenshaw MD Signature Date/Time: 02/05/2020/2:57:48 PM    Final      Subjective: No acute issues or events overnight, denies shortness of breath, chest pain, nausea, vomiting, diarrhea, constipation, headache, fevers, chills.   Discharge Exam: Vitals:   02/10/20 0000 02/10/20 0400  BP: 109/66 (!) 91/53  Pulse: (!) 52 (!) 51  Resp: 16 15  Temp:  97.6 F (36.4 C)  SpO2: 94% 93%   Vitals:   02/09/20 2000 02/09/20 2200 02/10/20 0000 02/10/20 0400  BP: 105/68 104/71 109/66 (!) 91/53  Pulse: (!) 59 (!) 55 (!) 52 (!) 51  Resp: 18 17 16 15   Temp: 98.1 F (36.7 C)    97.6 F (36.4 C)  TempSrc: Oral   Oral  SpO2: 93% 94% 94% 93%  Weight:      Height:        General: Pt is alert, awake, not in acute distress Cardiovascular: RRR, S1/S2 +, no rubs, no gallops Respiratory: CTA bilaterally, no wheezing, no rhonchi Abdominal: Soft, NT, ND, bowel sounds + Extremities: no edema, no cyanosis    The results of significant diagnostics from this hospitalization (including imaging, microbiology, ancillary and laboratory) are listed below for reference.     Microbiology: Recent Results (from the past 240 hour(s))  SARS Coronavirus 2 by RT PCR (hospital order, [facility]) Nasopharyngeal Nasopharyngeal Swab     Status: Abnormal   Collection Time: 02/03/20 12:02 PM   Specimen: Nasopharyngeal Swab  Result Value Ref Range Status   SARS Coronavirus 2 POSITIVE (A) NEGATIVE Final    Comment: RESULT CALLED TO, READ BACK BY AND VERIFIED WITH: MARVA SIMMS RN @1328  02/03/2020 OLSONM (NOTE) SARS-CoV-2 target nucleic acids are DETECTED  SARS-CoV-2 RNA is generally detectable in upper respiratory specimens  during the acute phase of infection.  Positive results are indicative  of the presence of the identified virus, but do not rule out bacterial infection or co-infection with other pathogens not detected by the test.  Clinical correlation with patient history and  other diagnostic information is necessary to determine patient infection status.  The expected result is negative.  Fact Sheet for Patients:   BoilerBrush.com.cyhttps://www.fda.gov/media/136312/download   Fact Sheet for Healthcare Providers:   https://pope.com/https://www.fda.gov/media/136313/download  This test is not yet approved or cleared by the [place] and  has been authorized for detection and/or diagnosis of SARS-CoV-2 by FDA under an Emergency Use Authorization (EUA).  This EUA will remain in effect (meaning thi s test can be used) for the duration of  the COVID-19 declaration under  Section 564(b)(1) of the Act, 21 U.S.C. section 360-bbb-3(b)(1), unless the authorization is terminated or revoked sooner.  Performed at [facility], [address] [place], [place] [address]   Blood Culture (routine x 2)     Status: None   Collection Time: 02/03/20 12:15 PM   Specimen: BLOOD  Result Value Ref Range Status   Specimen Description   Final    BLOOD LEFT ANTECUBITAL Performed at [facility], [address] [place], [place] [address]    Special Requests   Final    BOTTLES DRAWN AEROBIC AND ANAEROBIC Blood Culture adequate volume Performed at [facility], [address] [place], [place] [address]    Culture   Final    NO GROWTH 5 DAYS Performed at [facility], [address] [place], [place] [address]    Report Status 02/08/2020 FINAL  Final  Blood Culture (routine x 2)     Status: None   Collection Time: 02/03/20 12:20 PM   Specimen: BLOOD  Result Value Ref Range Status   Specimen Description   Final    BLOOD BLOOD LEFT HAND Performed at [facility], [address] [place], [place] [address]    Special Requests   Final    BOTTLES DRAWN AEROBIC AND ANAEROBIC Blood Culture adequate volume Performed at [facility], [address] [place], [place] [address]    Culture   Final    NO GROWTH 5 DAYS Performed at [facility], [address] [place], [place] [address]    Report Status 02/08/2020 FINAL  Final  MRSA PCR Screening     Status: None   Collection Time: 02/04/20  1:46 PM   Specimen: Nasopharyngeal  Result Value Ref Range Status   MRSA by PCR NEGATIVE NEGATIVE Final    Comment:        The GeneXpert MRSA Assay (FDA approved for NASAL specimens only), is one component of a comprehensive MRSA colonization surveillance program. It is not intended to diagnose MRSA infection nor to guide or monitor treatment for MRSA infections. Performed at [facility],  [address], [place], [place] [address]      Labs: BNP (last 3 results) No results for input(s): BNP in the last 8760 hours. Basic Metabolic Panel: Recent Labs  Lab 02/05/20 0238 02/05/20 0238 02/06/20 0236 02/07/20 0112 02/08/20 0305 02/09/20 0248 02/10/20 0152  NA 138   < > 135 136 130* 134* 135  K 4.8   < > 4.8 4.6 4.3 4.6 4.4  CL 101   < > 101 104 100 100 101  CO2 28   < > 26 25 23 26 27   GLUCOSE 182*   < > 176* 159* 160* 198* 149*  BUN 24*   < > 20 23* 24* 25* 30*  CREATININE 0.92   < > 0.80 0.70 0.79 0.83 0.92  CALCIUM 8.0*   < > 7.7* 7.6* 7.4* 7.9* 7.8*  MG 2.6*  --  2.7*  --   --   --   --    < > =  values in this interval not displayed.   Liver Function Tests: Recent Labs  Lab 02/06/20 0236 02/07/20 0112 02/08/20 0305 02/09/20 0248 02/10/20 0152  AST 92* 73* ALT 97* 143* 103* 91* 81*  ALKPHOS 174* 169* 152* 141* 140*  BILITOT 0.7 0.7 0.3 0.6 0.8  PROT 6.9 6.5 6.3* 6.0* 5.9*  ALBUMIN 2.9* 2.8* 2.8* 2.8* 2.8*   No results for input(s): LIPASE, AMYLASE in the last 168 hours. No results for input(s): AMMONIA in the last 168 hours. CBC: Recent Labs  Lab 02/06/20 0236 02/07/20 0112 02/08/20 0305 02/09/20 0248 02/10/20 0152  WBC 12.3* 12.7* 11.9* 13.4* 12.1*  NEUTROABS 9.8* 10.1* 9.1* 10.2* 8.9*  HGB 13.9 13.3 13.6 13.8 13.4  HCT 42.2 40.6 41.1 41.7 40.3  MCV 89.8 90.2 88.8 88.7 89.4  PLT 450* 490* 494* 495* 459*   Cardiac Enzymes: No results for input(s): CKTOTAL, CKMB, CKMBINDEX, TROPONINI in the last 168 hours. BNP: Invalid input(s): POCBNP CBG: Recent Labs  Lab 02/08/20 2204 02/09/20 0906 02/09/20 1304 02/09/20 1651 02/09/20 2113  GLUCAP 193* 137* 144* 161* 120*   D-Dimer Recent Labs    02/09/20 0248 02/10/20 0152  DDIMER 2.05* 3.00*   Hgb A1c No results for input(s): HGBA1C in the last 72 hours. Lipid Profile No results for input(s): CHOL, HDL, LDLCALC, TRIG, CHOLHDL, LDLDIRECT in the last 72 hours. Thyroid  function studies No results for input(s): TSH, T4TOTAL, T3FREE, THYROIDAB in the last 72 hours.  Invalid input(s): FREET3 Anemia work up No results for input(s): VITAMINB12, FOLATE, FERRITIN, TIBC, IRON, RETICCTPCT in the last 72 hours. Urinalysis No results found for: COLORURINE, APPEARANCEUR, LABSPEC, PHURINE, GLUCOSEU, HGBUR, BILIRUBINUR, KETONESUR, PROTEINUR, UROBILINOGEN, NITRITE, LEUKOCYTESUR Sepsis Labs Invalid input(s): PROCALCITONIN,  WBC,  LACTICIDVEN Microbiology Recent Results (from the past 240 hour(s))  SARS Coronavirus 2 by RT PCR (hospital order, performed in [facility] hospital lab) Nasopharyngeal Nasopharyngeal Swab     Status: Abnormal   Collection Time: 02/03/20 12:02 PM   Specimen: Nasopharyngeal Swab  Result Value Ref Range Status   SARS Coronavirus 2 POSITIVE (A) NEGATIVE Final    Comment: RESULT CALLED TO, READ BACK BY AND VERIFIED WITH: MARVA SIMMS RN  02/03/2020 OLSONM (NOTE) SARS-CoV-2 target nucleic acids are DETECTED  SARS-CoV-2 RNA is generally detectable in upper respiratory specimens  during the acute phase of infection.  Positive results are indicative  of the presence of the identified virus, but do not rule out bacterial infection or co-infection with other pathogens not detected by the test.  Clinical correlation with patient history and  other diagnostic information is necessary to determine patient infection status.  The expected result is negative.  Fact Sheet for Patients:   BoilerBrush.com.cy   Fact Sheet for Healthcare Providers:   https://pope.com/    This test is not yet approved or cleared by the [place] FDA and  has been authorized for detection and/or diagnosis of SARS-CoV-2 by FDA under an Emergency Use Authorization (EUA).  This EUA will remain in effect (meaning thi s test can be used) for the duration of  the COVID-19 declaration under Section 564(b)(1) of the Act,  21 U.S.C. section 360-bbb-3(b)(1), unless the authorization is terminated or revoked sooner.  Performed at [facility], [address] [place], [place] [address]   Blood Culture (routine x 2)     Status: None   Collection Time: 02/03/20 12:15 PM   Specimen: BLOOD  Result Value Ref Range Status   Specimen  Description   Final    BLOOD LEFT ANTECUBITAL Performed at [facility], [address] [place], [place] [address]    Special Requests   Final    BOTTLES DRAWN AEROBIC AND ANAEROBIC Blood Culture adequate volume Performed at [facility], [address] [place], [place] [address]    Culture   Final    NO GROWTH 5 DAYS Performed at [facility], [address] [place], [place] [address]    Report Status 02/08/2020 FINAL  Final  Blood Culture (routine x 2)     Status: None   Collection Time: 02/03/20 12:20 PM   Specimen: BLOOD  Result Value Ref Range Status   Specimen Description   Final    BLOOD BLOOD LEFT HAND Performed at [facility], [address] [place], [place] [address]    Special Requests   Final    BOTTLES DRAWN AEROBIC AND ANAEROBIC Blood Culture adequate volume Performed at [facility], [address] [place], [place] [address]    Culture   Final    NO GROWTH 5 DAYS Performed at [facility], [address] [place], [place] [address]    Report Status 02/08/2020 FINAL  Final  MRSA PCR Screening     Status: None   Collection Time: 02/04/20  1:46 PM   Specimen: Nasopharyngeal  Result Value Ref Range Status   MRSA by PCR NEGATIVE NEGATIVE Final    Comment:        The GeneXpert MRSA Assay (FDA approved for NASAL specimens only), is one component of a comprehensive MRSA colonization surveillance program. It is not intended to diagnose MRSA infection nor to guide or monitor treatment for MRSA infections. Performed at [facility], [address]  [place], [place] [address]      Time coordinating discharge: Over 30 minutes  SIGNED:   Azucena Fallen, DO [facility] 02/10/2020, 7:05 AM Pager   If 7PM-7AM, please contact night-coverage [email or web address]

## 2020-02-10 NOTE — TOC Transition Note (Signed)
Transition of Care [facility]) - CM/SW Discharge Note   Patient Details  Name: Victor Rich MRN: [id number] Date of Birth: August 12, 1975  Transition of Care [facility]) CM/SW Contact:  Golda Acre, RN Phone Number: 02/10/2020, 8:52 AM   Clinical Narrative:    Patient being dcd to home with o2 at 4l/min, Louanna Raw with [employer] notified at [phone]     Barriers to Discharge: Continued Medical Work up   Patient Goals and CMS Choice Patient states their goals for this hospitalization and ongoing recovery are:: to go home CMS Medicare.gov Compare Post Acute Care list provided to:: Patient    Discharge Placement                       Discharge Plan and Services   Discharge Planning Services: CM Consult                                 Social Determinants of Health (SDOH) Interventions     Readmission Risk Interventions No flowsheet data found.

## 2020-02-17 ENCOUNTER — Emergency Department (HOSPITAL_BASED_OUTPATIENT_CLINIC_OR_DEPARTMENT_OTHER): Payer: Medicaid Other

## 2020-02-17 ENCOUNTER — Encounter (HOSPITAL_BASED_OUTPATIENT_CLINIC_OR_DEPARTMENT_OTHER): Payer: Self-pay

## 2020-02-17 ENCOUNTER — Other Ambulatory Visit: Payer: Self-pay

## 2020-02-17 ENCOUNTER — Emergency Department (HOSPITAL_BASED_OUTPATIENT_CLINIC_OR_DEPARTMENT_OTHER)
Admission: EM | Admit: 2020-02-17 | Discharge: 2020-02-17 | Disposition: A | Discharge status: Home / Self Care | Payer: Medicaid Other | Attending: Emergency Medicine | Admitting: Emergency Medicine

## 2020-02-17 DIAGNOSIS — Z5321 Procedure and treatment not carried out due to patient leaving prior to being seen by health care provider: Secondary | ICD-10-CM | POA: Insufficient documentation

## 2020-02-17 DIAGNOSIS — K5900 Constipation, unspecified: Secondary | ICD-10-CM | POA: Insufficient documentation

## 2020-02-17 NOTE — ED Triage Notes (Addendum)
Pt c/o constipation x today-last BM yesterday-NAD-steady gait-pt with recent admn for covid

## 2020-02-17 NOTE — ED Notes (Signed)
Per registration, pt leaving without being seen at this time

## 2020-02-23 ENCOUNTER — Other Ambulatory Visit: Payer: Self-pay | Admitting: Internal Medicine

## 2020-02-29 ENCOUNTER — Emergency Department (HOSPITAL_COMMUNITY)
Admission: EM | Admit: 2020-02-29 | Discharge: 2020-02-29 | Disposition: A | Discharge status: Home / Self Care | Payer: Medicaid Other | Attending: Emergency Medicine | Admitting: Emergency Medicine

## 2020-02-29 ENCOUNTER — Emergency Department (HOSPITAL_COMMUNITY): Payer: Medicaid Other

## 2020-02-29 ENCOUNTER — Emergency Department (HOSPITAL_BASED_OUTPATIENT_CLINIC_OR_DEPARTMENT_OTHER)
Admit: 2020-02-29 | Discharge: 2020-02-29 | Disposition: A | Discharge status: Home / Self Care | Payer: Medicaid Other | Attending: Emergency Medicine | Admitting: Emergency Medicine

## 2020-02-29 ENCOUNTER — Ambulatory Visit: Payer: Self-pay | Admitting: *Deleted

## 2020-02-29 ENCOUNTER — Encounter (HOSPITAL_COMMUNITY): Payer: Self-pay

## 2020-02-29 ENCOUNTER — Other Ambulatory Visit: Payer: Self-pay

## 2020-02-29 DIAGNOSIS — Z8616 Personal history of COVID-19: Secondary | ICD-10-CM | POA: Insufficient documentation

## 2020-02-29 DIAGNOSIS — I82409 Acute embolism and thrombosis of unspecified deep veins of unspecified lower extremity: Secondary | ICD-10-CM

## 2020-02-29 DIAGNOSIS — I824Z1 Acute embolism and thrombosis of unspecified deep veins of right distal lower extremity: Secondary | ICD-10-CM

## 2020-02-29 DIAGNOSIS — I2693 Single subsegmental pulmonary embolism without acute cor pulmonale: Secondary | ICD-10-CM | POA: Insufficient documentation

## 2020-02-29 DIAGNOSIS — M7989 Other specified soft tissue disorders: Secondary | ICD-10-CM | POA: Diagnosis not present

## 2020-02-29 DIAGNOSIS — I2699 Other pulmonary embolism without acute cor pulmonale: Secondary | ICD-10-CM

## 2020-02-29 DIAGNOSIS — I82401 Acute embolism and thrombosis of unspecified deep veins of right lower extremity: Secondary | ICD-10-CM | POA: Insufficient documentation

## 2020-02-29 DIAGNOSIS — I2694 Multiple subsegmental pulmonary emboli without acute cor pulmonale: Secondary | ICD-10-CM

## 2020-02-29 DIAGNOSIS — R2241 Localized swelling, mass and lump, right lower limb: Secondary | ICD-10-CM | POA: Diagnosis present

## 2020-02-29 DIAGNOSIS — I824Y1 Acute embolism and thrombosis of unspecified deep veins of right proximal lower extremity: Secondary | ICD-10-CM

## 2020-02-29 HISTORY — DX: COVID-19: U07.1

## 2020-02-29 LAB — CBC WITH DIFFERENTIAL/PLATELET
Abs Immature Granulocytes: 0.1 10*3/uL — ABNORMAL HIGH (ref 0.00–0.07)
Basophils Absolute: 0 10*3/uL (ref 0.0–0.1)
Basophils Relative: 1 %
Eosinophils Absolute: 0.2 10*3/uL (ref 0.0–0.5)
Eosinophils Relative: 3 %
HCT: 45.5 % (ref 39.0–52.0)
Hemoglobin: 15.3 g/dL (ref 13.0–17.0)
Immature Granulocytes: 2 %
Lymphocytes Relative: 26 %
Lymphs Abs: 1.6 10*3/uL (ref 0.7–4.0)
MCH: 30.5 pg (ref 26.0–34.0)
MCHC: 33.6 g/dL (ref 30.0–36.0)
MCV: 90.6 fL (ref 80.0–100.0)
Monocytes Absolute: 0.7 10*3/uL (ref 0.1–1.0)
Monocytes Relative: 11 %
Neutro Abs: 3.6 10*3/uL (ref 1.7–7.7)
Neutrophils Relative %: 57 %
Platelets: 190 10*3/uL (ref 150–400)
RBC: 5.02 MIL/uL (ref 4.22–5.81)
RDW: 15.6 % — ABNORMAL HIGH (ref 11.5–15.5)
WBC: 6.2 10*3/uL (ref 4.0–10.5)
nRBC: 0 % (ref 0.0–0.2)

## 2020-02-29 LAB — BASIC METABOLIC PANEL
Anion gap: 9 (ref 5–15)
BUN: 14 mg/dL (ref 6–20)
CO2: 24 mmol/L (ref 22–32)
Calcium: 8.6 mg/dL — ABNORMAL LOW (ref 8.9–10.3)
Chloride: 106 mmol/L (ref 98–111)
Creatinine, Ser: 0.87 mg/dL (ref 0.61–1.24)
GFR calc Af Amer: >60 mL/min (ref >60)
GFR calc non Af Amer: >60 mL/min (ref >60)
Glucose, Bld: 105 mg/dL — ABNORMAL HIGH (ref 70–99)
Potassium: 3.9 mmol/L (ref 3.5–5.1)
Sodium: 139 mmol/L (ref 135–145)

## 2020-02-29 MED ORDER — IOHEXOL 350 MG/ML SOLN
100.0000 mL | Freq: Once | INTRAVENOUS | Status: AC | PRN
  Administered 2020-02-29: 100 mL via INTRAVENOUS

## 2020-02-29 MED ORDER — SODIUM CHLORIDE (PF) 0.9 % IJ SOLN
INTRAMUSCULAR | Status: AC
  Filled 2020-02-29: qty 50

## 2020-02-29 MED ORDER — HEPARIN BOLUS VIA INFUSION: 2100.0000 [IU] | Freq: Once | INTRAVENOUS | Status: DC

## 2020-02-29 MED ORDER — HEPARIN (PORCINE) 25000 UT/250ML-% IV SOLN: 1300.0000 [IU]/h | INTRAVENOUS | Status: DC

## 2020-02-29 MED ORDER — ENOXAPARIN SODIUM 80 MG/0.8ML ~~LOC~~ SOLN
1.0000 mg/kg | Freq: Two times a day (BID) | SUBCUTANEOUS | Status: DC
  Administered 2020-02-29: 75 mg via SUBCUTANEOUS
  Filled 2020-02-29: qty 0.75

## 2020-02-29 MED ORDER — APIXABAN (ELIQUIS) VTE STARTER PACK (10MG AND 5MG): ORAL_TABLET | ORAL | 0 refills | Status: DC

## 2020-02-29 NOTE — ED Notes (Signed)
Ambulatory sats from 95-97% on room air.

## 2020-02-29 NOTE — Consult Note (Signed)
CONSULT Note    Victor Rich QIO:[id number] DOB: 07-31-1975 DOA: 02/29/2020  PCP: Patient, No Pcp Per   Chief Complaint: Leg swelling  HPI: Victor Rich is a 44 y.o. male with no known medical history who presented with RLE leg swelling and pain - noted to have DVT on imaging and subsequent bilateral PE noted on CTA.  Patient does admit to mild fatigue and dyspnea with exertion but only minimally improving from previous hospitalization for Covid pneumonia. Given patient remains without hypoxia and leg swelling is minimal and does not limit patient's ambulation and pain is currently well controlled on over-the-counter NSAIDs we discussed observation admission to the hospital for IV heparin to transition to p.o. Eliquis in the next 12 to 24 hours versus discharge home after Lovenox shot and initiation of Eliquis tomorrow morning.  Patient requested Lovenox and discharge home so he can be with his family as his brother is not doing well after his Covid diagnosis.  At this time patient is pending Lovenox administration, Eliquis prescription has been sent to his pharmacy at [facility] in [facility] patient is otherwise stable and agreeable for discharge home.  We had a lengthy discussion about need for close monitoring of his symptoms, he is to report to his PCP as early as tomorrow to update on his new diagnosis, if his symptoms worsen he should return back to the ED as discussed.  Review of Systems: As per HPI notable for right lower extremity foot swelling and pain with dorsiflexion. Denies nausea, vomiting, diarrhea, constipation, headache, fevers, chills.   Assessment/Plan Active Problems:   DVT (deep venous thrombosis) (HCC)   Pulmonary embolism (HCC)   Consultants:   We are  Procedures:   BLE [place]; CTA  Past Medical History:  Diagnosis Date  . COVID-19    History reviewed. No pertinent surgical history.   reports that he has never smoked. He has never used smokeless tobacco.  He reports that he does not drink alcohol and does not use drugs.  No Known Allergies  Family History  Problem Relation Age of Onset  . Diabetes Mother   . Hypertension Mother   . Diabetes Father   . Hypertension Father     Prior to Admission medications   Medication Sig Start Date End Date Taking? Authorizing Provider  Multiple Vitamin (MULTIVITAMIN ADULT) TABS Take 1 tablet by mouth daily.   Yes [provider]  APIXABAN Everlene Balls) VTE STARTER PACK (10MG  AND 5MG ) Take as directed on package: start with two-5mg  tablets twice daily for 7 days. On day 8, switch to one-5mg  tablet twice daily. 02/29/20   , MD  predniSONE (DELTASONE) 10 MG tablet Take 10 mg by mouth daily with breakfast.  02/09/20   [provider]  SM TUSSIN DM 100-10 MG/5ML liquid Take 10 mLs by mouth every 4 (four) hours as needed (cough).  02/09/20   [provider]    Physical Exam: Vitals:   02/29/20 1745 02/29/20 1749 02/29/20 1800 02/29/20 1830  BP: 122/84 122/84 (!) 130/96 101/83  Pulse: 85 82 92 85  Resp:  (!) 27 18 19   Temp:      TempSrc:      SpO2: 95% 95% 94% 95%  Weight:      Height:        Constitutional: NAD, calm, comfortable Vitals:   02/29/20 1745 02/29/20 1749 02/29/20 1800 02/29/20 1830  BP: 122/84 122/84 (!) 130/96 101/83  Pulse: 85 82 92 85  Resp:  (!)  27 18 19   Temp:      TempSrc:      SpO2: 95% 95% 94% 95%  Weight:      Height:       General:  Pleasantly resting in bed, No acute distress. HEENT:  Normocephalic atraumatic.  Sclerae nonicteric, noninjected.  Extraocular movements intact bilaterally. Neck:  Without mass or deformity.  Trachea is midline. Lungs:  Clear to auscultate bilaterally without rhonchi, wheeze, or rales. Heart:  Regular rate and rhythm.  Without murmurs, rubs, or gallops. Abdomen:  Soft, nontender, nondistended.  Without guarding or rebound. Extremities: Without cyanosis, clubbing, 2+ pitting edema Vascular:   Dorsalis pedis and posterior tibial pulses palpable bilaterally. Skin:  Warm and dry, no erythema, no ulcerations.  Labs on Admission: I have personally reviewed following labs and imaging studies  CBC: Recent Labs  Lab 02/29/20 1538  WBC 6.2  NEUTROABS 3.6  HGB 15.3  HCT 45.5  MCV 90.6  PLT 190   Basic Metabolic Panel: Recent Labs  Lab 02/29/20 1538  NA 139  K 3.9  CL 106  CO2 24  GLUCOSE 105*  BUN 14  CREATININE 0.87  CALCIUM 8.6*   GFR: Estimated Creatinine Clearance: 100.8 mL/min (by C-G formula based on SCr of 0.87 mg/dL). Liver Function Tests: No results for input(s): AST, ALT, ALKPHOS, BILITOT, PROT, ALBUMIN in the last 168 hours. No results for input(s): LIPASE, AMYLASE in the last 168 hours. No results for input(s): AMMONIA in the last 168 hours. Coagulation Profile: No results for input(s): INR, PROTIME in the last 168 hours. Cardiac Enzymes: No results for input(s): CKTOTAL, CKMB, CKMBINDEX, TROPONINI in the last 168 hours. BNP (last 3 results) No results for input(s): PROBNP in the last 8760 hours. HbA1C: No results for input(s): HGBA1C in the last 72 hours. CBG: No results for input(s): GLUCAP in the last 168 hours. Lipid Profile: No results for input(s): CHOL, HDL, LDLCALC, TRIG, CHOLHDL, LDLDIRECT in the last 72 hours. Thyroid Function Tests: No results for input(s): TSH, T4TOTAL, FREET4, T3FREE, THYROIDAB in the last 72 hours. Anemia Panel: No results for input(s): VITAMINB12, FOLATE, FERRITIN, TIBC, IRON, RETICCTPCT in the last 72 hours. Urine analysis:    Component Value Date/Time   COLORURINE YELLOW 07/26/2008 1255   APPEARANCEUR CLEAR 07/26/2008 1255   LABSPEC 1.015 07/26/2008 1255   PHURINE 6.5 07/26/2008 1255   GLUCOSEU NEGATIVE 07/26/2008 1255   HGBUR NEGATIVE 07/26/2008 1255   BILIRUBINUR NEGATIVE 07/26/2008 1255   KETONESUR NEGATIVE 07/26/2008 1255   PROTEINUR NEGATIVE 07/26/2008 1255   UROBILINOGEN 0.2 07/26/2008 1255   NITRITE  NEGATIVE 07/26/2008 1255   LEUKOCYTESUR  07/26/2008 1255    NEGATIVE MICROSCOPIC NOT DONE ON URINES WITH NEGATIVE PROTEIN, BLOOD, LEUKOCYTES, NITRITE, OR GLUCOSE <1000 mg/dL.    Radiological Exams on Admission: DG Chest 2 View  Result Date: 02/29/2020 CLINICAL DATA:  Shortness of breath, recent COVID-19 hospitalization EXAM: CHEST - 2 VIEW COMPARISON:  None. FINDINGS: Multifocal heterogeneous interstitial airspace opacities in both lungs. No pneumothorax. No effusion. The cardiomediastinal contours are unremarkable. No acute osseous or soft tissue abnormality. IMPRESSION: Multifocal heterogeneous interstitial airspace opacities in both lungs, compatible with multifocal infection/inflammation in the setting of COVID-19. Electronically Signed   By: Kreg ShropshirePrice  DeHay M.D.   On: 02/29/2020 16:08   CT Angio Chest PE W/Cm &/Or Wo Cm  Result Date: 02/29/2020 CLINICAL DATA:  Dyspnea, right leg swelling, COVID pneumonia EXAM: CT ANGIOGRAPHY CHEST WITH CONTRAST TECHNIQUE: Multidetector CT imaging of the chest was performed using  the standard protocol during bolus administration of intravenous contrast. Multiplanar CT image reconstructions and MIPs were obtained to evaluate the vascular anatomy. CONTRAST:  OMNIPAQUE IOHEXOL 350 MG/ML SOLN COMPARISON:  None. FINDINGS: Cardiovascular: There is adequate opacification of the central pulmonary arteries. While the imaging is slightly limited by motion artifact, the examination is still diagnostic. There are several intraluminal filling defects seen predominantly within the right middle and lower lobe segmental pulmonary arteries as well as the anteromedial segmental pulmonary arteries of the left lower lobe in keeping with acute pulmonary embolism. The central pulmonary arteries are of normal caliber. Cardiac size within normal limits. No CT evidence of right heart strain. No pericardial effusion. The thoracic aorta is unremarkable. Mediastinum/Nodes: No enlarged  mediastinal, hilar, or axillary lymph nodes. Thyroid gland, trachea, and esophagus demonstrate no significant findings. Lungs/Pleura: There are multifocal peripheral, asymmetric, basilar predominant pulmonary infiltrates present in keeping with changes of atypical infection and compatible with the given history of COVID-19 pneumonia. No pneumothorax or pleural effusion. Central airways are widely patent. Upper Abdomen: Cholelithiasis noted without superimposed pericholecystic inflammatory change. Musculoskeletal: No chest wall abnormality. No acute or significant osseous findings. Review of the MIP images confirms the above findings. IMPRESSION: 1. Findings in keeping with acute bilateral pulmonary embolism, right greater than left. No CT evidence of right heart strain. 2. Multifocal peripheral, asymmetric, basilar predominant pulmonary infiltrates in keeping with changes of atypical infection and compatible with the given history of COVID-19 pneumonia. 3. Cholelithiasis. Electronically Signed   By: Helyn Numbers MD   On: 02/29/2020 17:34   VAS [place] LOWER EXTREMITY VENOUS (DVT) (ONLY [facility] & [facility])  Result Date: 02/29/2020  Lower Venous DVTStudy Indications: Swelling.  Risk Factors: History of COVID 19. Comparison Study: No prior studies. Performing Technologist: Chanda Busing RVT  Examination Guidelines: A complete evaluation includes B-mode imaging, spectral Doppler, color Doppler, and power Doppler as needed of all accessible portions of each vessel. Bilateral testing is considered an integral part of a complete examination. Limited examinations for reoccurring indications may be performed as noted. The reflux portion of the exam is performed with the patient in reverse Trendelenburg.  +---------+---------------+---------+-----------+----------+--------------+ RIGHT    CompressibilityPhasicitySpontaneityPropertiesThrombus Aging +---------+---------------+---------+-----------+----------+--------------+ CFV       Full           Yes      Yes                                 +---------+---------------+---------+-----------+----------+--------------+ SFJ      Full                                                        +---------+---------------+---------+-----------+----------+--------------+ FV Prox  Full                                                        +---------+---------------+---------+-----------+----------+--------------+ FV Mid   Full                                                        +---------+---------------+---------+-----------+----------+--------------+  FV DistalFull                                                        +---------+---------------+---------+-----------+----------+--------------+ PFV      Full                                                        +---------+---------------+---------+-----------+----------+--------------+ POP      Partial        Yes      Yes                  Acute          +---------+---------------+---------+-----------+----------+--------------+ PTV      None                                         Acute          +---------+---------------+---------+-----------+----------+--------------+ PERO     None                                         Acute          +---------+---------------+---------+-----------+----------+--------------+ Gastroc  Full                                                        +---------+---------------+---------+-----------+----------+--------------+   +----+---------------+---------+-----------+----------+--------------+ LEFTCompressibilityPhasicitySpontaneityPropertiesThrombus Aging +----+---------------+---------+-----------+----------+--------------+ CFV Full           Yes      Yes                                 +----+---------------+---------+-----------+----------+--------------+     Summary: RIGHT: - Findings consistent with acute deep vein thrombosis  involving the right popliteal vein, right posterior tibial veins, and right peroneal veins. - No cystic structure found in the popliteal fossa.  LEFT: - No evidence of common femoral vein obstruction.  *See table(s) above for measurements and observations. Electronically signed by Fabienne Bruns MD on 02/29/2020 at 4:19:04 PM.    Final     EKG: Independently reviewed.  Normal sinus rhythm without ST elevation or depression  Azucena Fallen DO [facility] For contact please use secure messenger on Epic  If 7PM-7AM, please contact night-coverage located on [email or web address]   02/29/2020, 6:59 PM

## 2020-02-29 NOTE — ED Triage Notes (Signed)
Patient states he was recently hospitalized with Covid. Patient c/o SOB x 1 week Patient also reports swelling from his right knee to foot x 3 days. Patient reports that he has been having tightness to both hands and feet as well.

## 2020-02-29 NOTE — Progress Notes (Signed)
Right lower extremity venous duplex has been completed. Preliminary results can be found in CV Proc through chart review.  Results were given to Dr. Freida Busman.  02/29/20 3:40 PM Olen Cordial RVT

## 2020-02-29 NOTE — Discharge Instructions (Signed)
Trombosis venosa profunda Deep Vein Thrombosis  La trombosis venosa profunda (TVP) es una afeccin en la que se forma un cogulo de sangre en una vena profunda, como una vena de la parte inferior de la pierna, del muslo o del brazo. Un cogulo es sangre que se ha espesado y convertido en gel o se ha solidificado. Esta afeccin es peligrosa. Puede causar complicaciones graves e incluso potencialmente mortales si el cogulo llega hasta los pulmones y causa una obstruccin (embolia pulmonar). Adems, puede daar las venas de la pierna. Puede causar dolor, hinchazn, manchas y llagas en la pierna (sndrome postrombtico). Cules son las causas? Esta afeccin puede ser causada por lo siguiente:  Una ralentizacin de la circulacin sangunea.  Dao en una vena.  Una afeccin que hace que la sangre se coagule ms fcilmente, como un trastorno de coagulacin hereditario. Qu incrementa el riesgo? Los siguientes factores pueden hacer que usted sea ms propenso a tener esta afeccin:  Tener sobrepeso.  Ser una persona mayor de edad, en especial mayor de 62aos.  Sentarse o acostarse durante ms de cuatro horas.  Estar en el hospital.  Falta de actividad fsica (estilo de vida sedentario).  Embarazo, parto o haber dado a luz recientemente.  Tomar medicamentos que contienen estrgeno, como los medicamentos para prevenir el embarazo.  Fumar.  Antecedentes de cualquiera de los siguientes: ? Cogulos de sangre o una enfermedad de coagulacin de la sangre. ? Enfermedad vascular perifrica. ? Enfermedad inflamatoria del intestino. ? Cncer. ? Enfermedad cardaca. ? Trastornos genticos que afectan la coagulacin de la sangre, tales como la mutacin del factor V Leiden. ? Enfermedades neurolgicas que afectan las piernas (paresia de piernas). ? Una lesin reciente, como un accidente automovilstico. ? Ciruga mayor o de duracin prolongada. ? Una va central colocada en una vena  grande. Cules son los signos o los sntomas? Los sntomas de esta afeccin incluyen los siguientes:  Hinchazn, dolor o sensibilidad en un brazo o una pierna.  Calor, enrojecimiento o manchas en un brazo o una pierna. Si el cogulo est ubicado en la pierna, los sntomas pueden ser ms notorios o empeorar al pararse o caminar. Algunas personas no presentan sntomas. Cmo se diagnostica? Esta afeccin se diagnostica mediante lo siguiente:  Un examen fsico y los antecedentes mdicos.  Estudios, como por ejemplo: ? Anlisis de sangre. Se realizan para determinar qu tan bien coagula la sangre. ? Ecografa. Se realiza para detectar la presencia de cogulos. ? Venograma. Para esta prueba, se inyecta un tinte de contraste en una vena y se toman radiografas para verificar si hay cogulos. Cmo se trata? El tratamiento de esta afeccin depende de lo siguiente:  La causa de su TVP.  Su riesgo de tener una hemorragia y tener ms cogulos.  Otras afecciones que padezca. El tratamiento puede incluir lo siguiente:  Tomar un diluyente sanguneo (anticoagulante). Este tipo de medicamento evita la formacin de cogulos. Se pueden administrar por va oral, con una inyeccin debajo de la piel o a travs de una va intravenosa (catter).  Inyeccin de medicamentos para disolver los cogulos en la vena afectada (tromblisis dirigida por catter).  Someterse a una ciruga. Se puede realizar una ciruga para lo siguiente: ? Extraer el cogulo. ? Colocar un filtro en una vena grande para capturar los cogulos de sangre antes de que lleguen a los pulmones. Es posible que algunos tratamientos deban continuarse hasta por seis meses. Siga estas indicaciones en su casa: Si toma anticoagulantes, tenga en cuenta lo siguiente:    Tmelos exactamente como se lo haya indicado el mdico. Algunos anticoagulantes deben tomarse a la [employer]. No se saltee una dosis.  Hable con su mdico antes  de tomar cualquier medicamento que contenga aspirina o antiinflamatorios no esteroides (AINE). Estos medicamentos aumentan el riesgo de tener una hemorragia peligrosa.  Pregntele a su mdico sobre los alimentos y medicamentos que pueden cambiar la forma en que funciona el medicamento (pueden Counselling psychologist). Evtelos si el mdico se lo indica.  Los anticoagulantes pueden causar hematomas fcilmente y hacer que dejar de sangrar sea ms difcil. Por este motivo: ? Tenga sumo cuidado al usar cuchillos, tijeras u otros objetos filosos. ? Afitese con Donnal Debar elctrica en lugar de hacerlo con una hoja de afeitar. ? Evite actividades que podran causar lesiones o moretones y [place] cadas.  Use un brazalete de alerta mdica o lleve una tarjeta que muestre qu medicamentos toma. Instrucciones generales  [employer] de venta libre y los recetados solamente como se lo haya indicado el mdico.  Reanude sus actividades normales como se lo haya indicado el mdico. Pregntele al mdico qu actividades son seguras para usted.  Use medias de compresin si se lo recomienda el mdico.  Concurra a todas las visitas de control como se lo haya indicado el mdico. Esto es importante. Cmo se evita? Para disminuir el riesgo de volver a sufrir esta afeccin:  Durante 30 minutos o ms [employer], realice una actividad que: ? Conseco y las piernas. ? Aumente su frecuencia cardaca.  Cuando viaje durante ms de cuatro horas: ? Engineer, mining con los brazos y las piernas cada una hora. ? Beba abundante agua. ? Evite el consumo de alcohol.  Evite permanecer sentado o parado durante perodos prolongados sin mover las piernas.  Si se somete a una ciruga o est hospitalizado, pregunte cmo evitar los cogulos de [place]. Esto puede incluir caminar frecuentemente o tomar anticoagulantes.  Mantenga un peso saludable.  Si es [employer] mayor de 37aos, evite el uso innecesario de medicamentos que contengan estrgeno, como las pldoras anticonceptivas.  No consuma ningn producto que contenga nicotina o tabaco, como cigarrillos y Psychologist, sport and exercise. Esto es de especial importancia si toma medicamentos con estrgeno. Si necesita ayuda para dejar de fumar, consulte al mdico. Comunquese con un mdico si:  Se saltea una dosis del anticoagulante.  Su perodo menstrual es ms abundante que lo normal.  Tiene hematomas fuera de lo comn. Solicite ayuda de inmediato si:  Tiene los siguientes sntomas: ? Social research officer, government, hinchazn o enrojecimiento nuevos en un brazo o una pierna, o un aumento de alguno de estos sntomas. ? Entumecimiento u hormigueo en un brazo o una pierna. ? Falta de aire. ? Tourist information centre manager. ? Latidos cardacos irregulares o rpidos. ? Dolor de cabeza intenso o confusin. ? Un corte que no deja de sangrar.  Observa sangre en el vmito, las heces o la [place].  Sufre una cada o un accidente grave o se golpea la cabeza.  Se siente mareado o siente que va a desvanecerse.  Tose y escupe sangre. Estos sntomas pueden representar un problema grave que constituye Engineer, maintenance (IT). No espere hasta que los sntomas desaparezcan. Solicite atencin mdica de inmediato. Comunquese con el servicio de emergencias de su localidad (911 en los [place]). No conduzca por sus propios medios [employer] hospital. Resumen  La trombosis venosa profunda (TVP) es una afeccin en la que  se forma un cogulo de sangre en una vena profunda, como una vena de la parte inferior de la pierna, del muslo o del brazo.  Los sntomas pueden incluir hinchazn, Airline pilot, Engineer, mining, enrojecimiento en el brazo o la pierna.  Esta afeccin puede tratarse con un diluyente de la sangre (anticoagulante), un medicamento que se inyecta para [employer] cogulos de sangre,medias de compresin o [place].  Si le recetan anticoagulantes, tmelos  exactamente como se lo hayan indicado. Esta informacin no tiene Theme park manager el consejo del mdico. Asegrese de hacerle al mdico cualquier pregunta que tenga. Document Revised: 01/01/2017 Document Reviewed: 01/01/2017 Elsevier Patient Education  2020 Elsevier Inc.     Embolia pulmonar Pulmonary Embolism  Una embolia pulmonar (EP) es una obstruccin repentina o una disminucin del flujo de sangre en uno o en ambos pulmones. La mayora de las obstrucciones proviene de un cogulo de sangre que se forma en una vena de una pierna, un muslo o un brazo (trombosis venosa profunda, TVP) y se traslada a los pulmones. Un cogulo es sangre que se ha espesado y convertido en gel o se ha solidificado. La EP es una afeccin peligrosa y potencialmente mortal que requiere tratamiento inmediato. Cules son las causas? Esta afeccin generalmente es causada por un cogulo de sangre que se forma en una vena y se traslada a los pulmones. En algunos casos raros, puede causarla aire, [place], parte de un tumor u otro tejido que se mueve a travs de las [employer]. Qu incrementa el riesgo? Los siguientes factores pueden hacer que sea ms propenso a Aeronautical engineer afeccin:  Tener una lesin traumtica, como fractura de cadera o pierna.  Tener: ? Lesin en la mdula espinal. ? Ciruga ortopdica, especialmente reemplazo de cadera o rodilla. ? Cualquier Civil Service fast streamer. ? Un accidente cerebrovascular. ? Trombosis venosa profunda (TVP). ? Cogulos de [place] o enfermedad de coagulacin de Risk manager. ? Una enfermedad cardaca o pulmonar a largo plazo (crnica). ? Tratamiento para el cncer con quimioterapia. ? Un catter venoso central.  Tomar medicamentos que contengan estrgenos. Estos incluyen las pldoras anticonceptivas y la terapia de reemplazo hormonal.  Tener las siguientes caractersticas: ? Mujeres embarazadas. ? Estar en el perodo de tiempo despus del parto  (posparto). ? [place] de [id number]. ? Sobrepeso. ? Ser fumador, especialmente si tiene otros riesgos. Cules son los signos o los sntomas? Los sntomas de esta afeccin generalmente comienzan de [place] repentina e incluyen lo siguiente:  Falta de aire durante la actividad o en reposo.  Tos o tos con sangre o expulsar mucosidad sanguinolenta al toser.  Dolor de pecho que empeora al inspirar profundamente.  Latidos cardacos irregulares o rpidos.  Sentirse aturdido o [employer].  Desmayos.  Sensacin de ansiedad.  Grant Ruts.  Sudoracin.  Dolor e hinchazn en una pierna. Este es un sntoma de TVP, que puede desencadenar [place]. Cmo se diagnostica? Esta afeccin se puede diagnosticar en funcin de lo siguiente:  Sus antecedentes mdicos.  Un examen fsico.  Anlisis de sangre.  Angiograma pulmonar por tomografa computarizada. Esta prueba analiza el flujo sanguneo dentro y alrededor [employer].  Gammagrafa pulmonar de ventilacin y perfusin, tambin llamada exploracin de VQ. Esta prueba mide el flujo de aire y el flujo de sangre que llega a los pulmones.  Una ecografa de las piernas. Cmo se trata? El tratamiento de esta afeccin depende de varios factores, tales como la causa de su EP, el riesgo de hemorragia o de que se formen ms cogulos,  y cualquier otra afeccin que tenga. El tratamiento pretende quitar, Restaurant manager, fast food o Restaurant manager, fast food formacin o el crecimiento de cogulos de [place]. El tratamiento puede incluir:  Medicamentos, como por ejemplo: ? Medicamentos diluyentes de la sangre (anticoagulantes) para Restaurant manager, fast food formacin de cogulos. ? Medicamentos que [employer] (trombolticos).  Procedimientos, por ejemplo: ? Usar un tubo flexible para extraer un cogulo de sangre (embolectoma) o para administrar medicamentos para destruirlo (tromblisis dirigida por catter). ? Insertar un filtro en una vena grande que lleva sangre al corazn (vena cava  inferior). Este filtro (filtro de vena cava) atrapa los cogulos de sangre antes de que lleguen a los pulmones. ? Ciruga para eliminar el cogulo (embolectoma [place]). Esto es poco frecuente. Es posible que necesite un tratamiento inmediato, uno a Air cabin crew (hasta 3 meses despus del diagnstico) y uno prolongado (ms de 3 meses despus del diagnstico). Su tratamiento puede continuar por varios meses (terapia de mantenimiento). Usted junto con su mdico trabajarn para elegir el mejor programa de tratamiento para usted. Siga estas indicaciones en su casa: Medicamentos  Tome los medicamentos de venta libre y los recetados solamente como se lo haya indicado el mdico.  Si toma un anticoagulante: ? International aid/development worker medicamento todos los [address] a la misma hora. ? Sepa cules alimentos y frmacos interactan con su medicamento. ? Conozca los efectos secundarios de este medicamento, incluida la excesiva formacin de moretones o sangrado. Consulte al mdico o al farmacutico acerca de otros efectos secundarios. Indicaciones generales  Use un brazalete de alerta mdica o lleve una tarjeta de alerta mdica que diga que ha tenido [place] EP y que [employer] medicamentos que [place].  Pregntele al mdico cundo podr retomar sus [employer]. Evite estar sentado o acostado durante perodos largos sin moverse.  Mantenga un peso saludable. Pregunte a su mdico cul es un peso saludable para usted.  No consuma ningn producto que contenga nicotina o tabaco, como cigarrillos, cigarrillos electrnicos y tabaco de Theatre manager. Si necesita ayuda para dejar de consumir, consulte al mdico.  Hable sobre sus planes de viaje con su mdico. Es importante que se asegure de que an podr tomar su medicamento mientras est de viaje.  Concurra a todas las visitas de control como se lo haya indicado el mdico. Esto es importante. Comunquese con un mdico si:  Se salte una dosis del anticoagulante. Solicite ayuda  inmediatamente si:  Tiene los siguientes sntomas: ? Dolor, hinchazn, calor o enrojecimiento nuevos en un brazo o una pierna o presenta un aumento de alguno de estos sntomas. ? Entumecimiento u hormigueo en un brazo o una pierna. ? Falta de [employer] o en reposo. ? Grant Ruts. ? Journalist, newspaper. ? Latidos cardacos irregulares o rpidos. ? Dolor de cabeza intenso. ? Cambios en la visin. ? Una cada o un accidente grave, o se golpea la cabeza. ? Dolor de estmago (abdominal). ? [employer], las heces o la [place]. ? Un corte que no deja de sangrar.  Tose y escupe sangre.  Se siente aturdido o mareado.  No puede mover los brazos o las piernas.  Se siente desorientado o pierde la memoria. Estos sntomas pueden representar un problema grave que constituye Radio broadcast assistant. No espere a ver si los sntomas desaparecen. Solicite atencin mdica de inmediato. Comunquese con el servicio de emergencias de su localidad (911 en los [place]). No conduzca por sus propios medios [employer] hospital. Resumen  Una embolia pulmonar (EP) es una obstruccin repentina o  una disminucin del flujo de sangre en uno o en ambos pulmones. La EP es una afeccin peligrosa y potencialmente mortal que requiere tratamiento inmediato.  Los tratamientos para esta afeccin generalmente incluyen medicamentos para diluir la sangre (anticoagulantes) o medicamentos para [employer] cogulos de Retail buyer (trombolticos).  Si le dan anticoagulantes, es importante que tome el medicamento a la misma hora [employer].  Sepa cules alimentos y frmacos interactan con los medicamentos que est tomando.  Comunquese con el servicio de emergencias de su localidad (911 en los [place]) si presenta signos de EP o TVP. Esta informacin no tiene Theme park manager el consejo del mdico. Asegrese de hacerle al mdico cualquier pregunta que tenga. Document Revised: 05/05/2018 Document Reviewed:  05/05/2018 Elsevier Patient Education  2020 [employer].   Information on my medicine - ELIQUIS (apixaban)  Why was Eliquis prescribed for you? Eliquis was prescribed to treat blood clots that may have been found in the veins of your legs (deep vein thrombosis) or in your lungs (pulmonary embolism) and to reduce the risk of them occurring again.  What do You need to know about Eliquis ? The starting dose is 10 mg (two 5 mg tablets) taken TWICE daily for the FIRST SEVEN (7) DAYS, then on (enter date)  03/08/20 the dose is reduced to ONE 5 mg tablet taken TWICE daily.  Eliquis may be taken with or without food.   Try to take the dose about the same time in the morning and in the evening. If you have difficulty swallowing the tablet whole please discuss with your pharmacist how to take the medication safely.  Take Eliquis exactly as prescribed and DO NOT stop taking Eliquis without talking to the doctor who prescribed the medication.  Stopping may increase your risk of developing a new blood clot.  Refill your prescription before you run out.  After discharge, you should have regular check-up appointments with your healthcare provider that is prescribing your Eliquis.    What do you do if you miss a dose? If a dose of ELIQUIS is not taken at the scheduled time, take it as soon as possible on the same day and twice-daily administration should be resumed. The dose should not be doubled to make up for a missed dose.  Important Safety Information A possible side effect of Eliquis is bleeding. You should call your healthcare provider right away if you experience any of the following: ? Bleeding from an injury or your nose that does not stop. ? Unusual colored urine (red or dark brown) or unusual colored stools (red or black). ? Unusual bruising for unknown reasons. ? A serious fall or if you hit your head (even if there is no bleeding).  Some medicines may interact with Eliquis and  might increase your risk of bleeding or clotting while on Eliquis. To help avoid this, consult your healthcare provider or pharmacist prior to using any new prescription or non-prescription medications, including herbals, vitamins, non-steroidal anti-inflammatory drugs (NSAIDs) and supplements.  This website has more information on Eliquis (apixaban): http://[email or web address].com/eliquis/home

## 2020-02-29 NOTE — Telephone Encounter (Signed)
Patient's wife calls in for him. He was hospitalized at [facility] almost 3 weeks ago for Covid complications-cannot find a note of this admission on chart at this time. He now has Right lower leg swelling with warmth and pain and increased short of breath while up and with eating. Denies fever at this time.Advised ED at this time to be evaluated for possible DVT.Wife will drive him now.   Reason for Disposition  [1] Thigh, calf, or ankle swelling AND [2] only 1 side  Answer Assessment - Initial Assessment Questions 1. ONSET: "When did the swelling start?" (e.g., minutes, hours, days)     3 days ago swelling in right ankle and foot. 2. LOCATION: "What part of the leg is swollen?"  "Are both legs swollen or just one leg?"     Lower leg 3. SEVERITY: "How bad is the swelling?" (e.g., localized; mild, moderate, severe)"now just a little, but in the afternoon"  - Localized - small area of swelling localized to one leg  - MILD pedal edema - swelling limited to foot and ankle, pitting edema < 1/4 inch (6 mm) deep, rest and elevation eliminate most or all swelling  - MODERATE edema - swelling of lower leg to knee, pitting edema > 1/4 inch (6 mm) deep, rest and elevation only partially reduce swelling  - SEVERE edema - swelling extends above knee, facial or hand swelling present      no 4. REDNESS: "Does the swelling look red or infected?"     no 5. PAIN: "Is the swelling painful to touch?" If Yes, ask: "How painful is it?"   (Scale 1-10; mild, moderate or severe)     yes 6. FEVER: "Do you have a fever?" If Yes, ask: "What is it, how was it measured, and when did it start?"      no 7. CAUSE: "What do you think is causing the leg swelling?"     unsure 8. MEDICAL HISTORY: "Do you have a history of heart failure, kidney disease, liver failure, or cancer?"     Recent high blood pressure while in hospital 9. RECURRENT SYMPTOM: "Have you had leg swelling before?" If Yes, ask: "When was the last time?" "What  happened that time?"     no 10. OTHER SYMPTOMS: "Do you have any other symptoms?" (e.g., chest pain, difficulty breathing)       Short of breath with activity and eating 11. PREGNANCY: "Is there any chance you are pregnant?" "When was your last menstrual period?"       na  Protocols used: LEG SWELLING AND EDEMA-A-AH

## 2020-02-29 NOTE — ED Provider Notes (Signed)
[facility]-EMERGENCY DEPT Provider Note   CSN: [id number] Arrival date & time: 02/29/20  1322     History Chief Complaint  Patient presents with  . Leg Swelling  . Shortness of Breath    Victor Rich is a 44 y.o. male who presents to the ED today with complaint of gradual onset, constant, RLE swelling and pain to calf x 1 week. Pt also complains of dyspnea on exertion. Of note patient mentions he was recently hospitalized for COVID 19 - I am unable to see this in our system however does appear pt has a merged chart: Pt was admitted to the hospital from 08/04 - 08/11 for COVID 19. He is unvaccinated. He states that he was at home for 11 days with symptoms of cough, fever, and SOB until he finally came to the hospital. He reports he had been doing well at home until about a week ago when he began having symptoms. Pt denies fevers, chills, chest pain. No hx DVT/PE.    The history is provided by the patient and medical records.       Past Medical History:  Diagnosis Date  . COVID-19     Patient Active Problem List   Diagnosis Date Noted  . DVT (deep venous thrombosis) (HCC) 02/29/2020  . Pulmonary embolism (HCC) 02/29/2020    History reviewed. No pertinent surgical history.     Family History  Problem Relation Age of Onset  . Diabetes Mother   . Hypertension Mother   . Diabetes Father   . Hypertension Father     Social History   Tobacco Use  . Smoking status: Never Smoker  . Smokeless tobacco: Never Used  Vaping Use  . Vaping Use: Never used  Substance Use Topics  . Alcohol use: Never  . Drug use: Never    Home Medications Prior to Admission medications   Medication Sig Start Date End Date Taking? Authorizing Provider  Multiple Vitamin (MULTIVITAMIN ADULT) TABS Take 1 tablet by mouth daily.   Yes [provider]  APIXABAN Everlene Balls) VTE STARTER PACK (10MG  AND 5MG ) Take as directed on package: start with two-5mg  tablets  twice daily for 7 days. On day 8, switch to one-5mg  tablet twice daily. 02/29/20   , MD  predniSONE (DELTASONE) 10 MG tablet Take 10 mg by mouth daily with breakfast.  02/09/20   [provider]  SM TUSSIN DM 100-10 MG/5ML liquid Take 10 mLs by mouth every 4 (four) hours as needed (cough).  02/09/20   [provider]    Allergies    Patient has no known allergies.  Review of Systems   Review of Systems  Constitutional: Positive for fatigue. Negative for chills and fever.  Respiratory: Positive for shortness of breath.   Cardiovascular: Positive for leg swelling. Negative for chest pain and palpitations.  All other systems reviewed and are negative.   Physical Exam Updated Vital Signs BP (!) 127/92 (BP Location: Right Arm)   Pulse 87   Temp 98.2 F (36.8 C) (Oral)   Resp 18   Ht 5\' 6"  (1.676 m)   Wt 74.4 kg   SpO2 94%   BMI 26.47 kg/m   Physical Exam Vitals and nursing note reviewed.  Constitutional:      Appearance: He is not ill-appearing or diaphoretic.  HENT:     Head: Normocephalic and atraumatic.  Eyes:     Conjunctiva/sclera: Conjunctivae normal.  Cardiovascular:     Rate and Rhythm:  Normal rate and regular rhythm.  Pulmonary:     Effort: Pulmonary effort is normal.     Breath sounds: Normal breath sounds. No decreased breath sounds, wheezing, rhonchi or rales.     Comments: Able to speak in full sentences. Satting 94% on RA.  Chest:     Chest wall: No tenderness.  Abdominal:     Palpations: Abdomen is soft.     Tenderness: There is no abdominal tenderness.  Musculoskeletal:     Cervical back: Neck supple.     Right lower leg: Tenderness present. Edema present.     Comments: Nonpitting edema to RLE; no edema to LLE. + TTP to the calf. Negative homan's sign. 2+ DP pulse.  Skin:    General: Skin is warm and dry.  Neurological:     Mental Status: He is alert.     ED Results / Procedures / Treatments   Labs (all labs  ordered are listed, but only abnormal results are displayed) Labs Reviewed  BASIC METABOLIC PANEL - Abnormal; Notable for the following components:      Result Value   Glucose, Bld 105 (*)    Calcium 8.6 (*)    All other components within normal limits  CBC WITH DIFFERENTIAL/PLATELET - Abnormal; Notable for the following components:   RDW 15.6 (*)    Abs Immature Granulocytes 0.10 (*)    All other components within normal limits    EKG EKG Interpretation  Date/Time:  Monday February 29 2020 14:18:40 EDT Ventricular Rate:  90 PR Interval:    QRS Duration: 83 QT Interval:  353 QTC Calculation: 432 R Axis:   -16 Text Interpretation: Sinus rhythm Borderline left axis deviation Confirmed by Geoffery Lyons ([id number]) on 02/29/2020 4:03:40 PM   Radiology DG Chest 2 View  Result Date: 02/29/2020 CLINICAL DATA:  Shortness of breath, recent COVID-19 hospitalization EXAM: CHEST - 2 VIEW COMPARISON:  None. FINDINGS: Multifocal heterogeneous interstitial airspace opacities in both lungs. No pneumothorax. No effusion. The cardiomediastinal contours are unremarkable. No acute osseous or soft tissue abnormality. IMPRESSION: Multifocal heterogeneous interstitial airspace opacities in both lungs, compatible with multifocal infection/inflammation in the setting of COVID-19. Electronically Signed   By: Kreg Shropshire M.D.   On: 02/29/2020 16:08   CT Angio Chest PE W/Cm &/Or Wo Cm  Result Date: 02/29/2020 CLINICAL DATA:  Dyspnea, right leg swelling, COVID pneumonia EXAM: CT ANGIOGRAPHY CHEST WITH CONTRAST TECHNIQUE: Multidetector CT imaging of the chest was performed using the standard protocol during bolus administration of intravenous contrast. Multiplanar CT image reconstructions and MIPs were obtained to evaluate the vascular anatomy. CONTRAST:  OMNIPAQUE IOHEXOL 350 MG/ML SOLN COMPARISON:  None. FINDINGS: Cardiovascular: There is adequate opacification of the central pulmonary arteries. While the  imaging is slightly limited by motion artifact, the examination is still diagnostic. There are several intraluminal filling defects seen predominantly within the right middle and lower lobe segmental pulmonary arteries as well as the anteromedial segmental pulmonary arteries of the left lower lobe in keeping with acute pulmonary embolism. The central pulmonary arteries are of normal caliber. Cardiac size within normal limits. No CT evidence of right heart strain. No pericardial effusion. The thoracic aorta is unremarkable. Mediastinum/Nodes: No enlarged mediastinal, hilar, or axillary lymph nodes. Thyroid gland, trachea, and esophagus demonstrate no significant findings. Lungs/Pleura: There are multifocal peripheral, asymmetric, basilar predominant pulmonary infiltrates present in keeping with changes of atypical infection and compatible with the given history of COVID-19 pneumonia. No pneumothorax or pleural effusion. Central airways  are widely patent. Upper Abdomen: Cholelithiasis noted without superimposed pericholecystic inflammatory change. Musculoskeletal: No chest wall abnormality. No acute or significant osseous findings. Review of the MIP images confirms the above findings. IMPRESSION: 1. Findings in keeping with acute bilateral pulmonary embolism, right greater than left. No CT evidence of right heart strain. 2. Multifocal peripheral, asymmetric, basilar predominant pulmonary infiltrates in keeping with changes of atypical infection and compatible with the given history of COVID-19 pneumonia. 3. Cholelithiasis. Electronically Signed   By: Helyn NumbersAshesh  Parikh MD   On: 02/29/2020 17:34   VAS US LOWER EXTREMITY VENOUS (DVT) (ONLY [facility] & [facility])  Result Date: 02/29/2020  Lower Venous DVTStudy Indications: Swelling.  Risk Factors: History of COVID 19. Comparison Study: No prior studies. Performing Technologist: Chanda BusingGregory Collins RVT  Examination Guidelines: A complete evaluation includes B-mode imaging, spectral  Doppler, color Doppler, and power Doppler as needed of all accessible portions of each vessel. Bilateral testing is considered an integral part of a complete examination. Limited examinations for reoccurring indications may be performed as noted. The reflux portion of the exam is performed with the patient in reverse Trendelenburg.  +---------+---------------+---------+-----------+----------+--------------+ RIGHT    CompressibilityPhasicitySpontaneityPropertiesThrombus Aging +---------+---------------+---------+-----------+----------+--------------+ CFV      Full           Yes      Yes                                 +---------+---------------+---------+-----------+----------+--------------+ SFJ      Full                                                        +---------+---------------+---------+-----------+----------+--------------+ FV Prox  Full                                                        +---------+---------------+---------+-----------+----------+--------------+ FV Mid   Full                                                        +---------+---------------+---------+-----------+----------+--------------+ FV DistalFull                                                        +---------+---------------+---------+-----------+----------+--------------+ PFV      Full                                                        +---------+---------------+---------+-----------+----------+--------------+ POP      Partial        Yes      Yes  Acute          +---------+---------------+---------+-----------+----------+--------------+ PTV      None                                         Acute          +---------+---------------+---------+-----------+----------+--------------+ PERO     None                                         Acute          +---------+---------------+---------+-----------+----------+--------------+ Gastroc  Full                                                         +---------+---------------+---------+-----------+----------+--------------+   +----+---------------+---------+-----------+----------+--------------+ LEFTCompressibilityPhasicitySpontaneityPropertiesThrombus Aging +----+---------------+---------+-----------+----------+--------------+ CFV Full           Yes      Yes                                 +----+---------------+---------+-----------+----------+--------------+     Summary: RIGHT: - Findings consistent with acute deep vein thrombosis involving the right popliteal vein, right posterior tibial veins, and right peroneal veins. - No cystic structure found in the popliteal fossa.  LEFT: - No evidence of common femoral vein obstruction.  *See table(s) above for measurements and observations. Electronically signed by Fabienne Bruns MD on 02/29/2020 at 4:19:04 PM.    Final     Procedures Procedures (including critical care time)  Medications Ordered in ED Medications  sodium chloride (PF) 0.9 % injection (has no administration in time range)  enoxaparin (LOVENOX) injection 75 mg (has no administration in time range)  iohexol (OMNIPAQUE) 350 MG/ML injection 100 mL (100 mLs Intravenous Contrast Given 02/29/20 1659)    ED Course  I have reviewed the triage vital signs and the nursing notes.  Pertinent labs & imaging results that were available during my care of the patient were reviewed by me and considered in my medical decision making (see chart for details).    MDM Rules/Calculators/A&P                          44 year old male who was recently admitted to the hospital from 08/04 to 08/11 for COVID-19 presenting to the ED today with complaint of dyspnea on exertion as well as right lower extremity swelling for the past week.  On arrival to the ED patient is afebrile, nontachycardic nontachypneic.  He appears to be in no acute distress.  He is able to speak in full sentences  without difficulty.  Satting 94% on room air.  Patient does have nonpitting edema to his right lower extremity compared to left with tenderness palpation to the calf.  DVT study was ordered while patient was in the waiting room, positive for clot.  Given complaint of dyspnea on exertion will obtain CTA at this time.  Will obtain lab work.   CBC without leukocytosis. Hgb stable at 15.3.  BMP without electrolyte abnormalities.   Received call from radiology department - CTA positive for PE;  no heart strain. Heparin per pharmacy consult ordered.   Discussed case with Dr. Natale Milch with [facility] who recommends ambulating patient with pulse ox on; he is not hypoxic and otherwise well appearing and may benefit from discharge home with eliquis.   Pt ambulated in the room; SaO2 at rest 95% and with ambulation 97%. Dr. Natale Milch to come evaluate patient and discuss admission vs discharge home; if pt is uncomfortable with discharge home then he will admit. Appreciate his involvement.   Dr. Natale Milch has evaluated patient; recommends discharge home. Has ordered Lovenox for patient with Eliquis at discharge. Pt given info for [facility] for follow up purposes.   This note was prepared using Dragon voice recognition software and may include unintentional dictation errors due to the inherent limitations of voice recognition software.  Victor Rich was evaluated in Emergency Department on 02/29/2020 for the symptoms described in the history of present illness. He was evaluated in the context of the global COVID-19 pandemic, which necessitated consideration that the patient might be at risk for infection with the SARS-CoV-2 virus that causes COVID-19. Institutional protocols and algorithms that pertain to the evaluation of patients at risk for COVID-19 are in a state of rapid change based on information released by regulatory bodies including the CDC and federal and state organizations.  These policies and algorithms were followed during the patient's care in the ED.   Final Clinical Impression(s) / ED Diagnoses Final diagnoses:  Single subsegmental pulmonary embolism without acute cor pulmonale (HCC)  Acute deep vein thrombosis (DVT) of proximal vein of right lower extremity (HCC)    Rx / DC Orders ED Discharge Orders         Ordered    APIXABAN (ELIQUIS) VTE STARTER PACK (10MG  AND 5MG )        02/29/20 1838    Increase activity slowly        02/29/20 1845    Diet - low sodium heart healthy        02/29/20 1845    Call MD for:  difficulty breathing, headache or visual disturbances        02/29/20 1845    Call MD for:  severe uncontrolled pain        02/29/20 1845           03/02/20, PA-C 02/29/20 1937    Tanda Rockers, MD 02/29/20 2352

## 2020-03-04 ENCOUNTER — Encounter (HOSPITAL_COMMUNITY): Payer: Self-pay

## 2020-03-15 ENCOUNTER — Other Ambulatory Visit: Payer: Self-pay

## 2020-03-15 ENCOUNTER — Ambulatory Visit (INDEPENDENT_AMBULATORY_CARE_PROVIDER_SITE_OTHER): Payer: Medicaid Other | Admitting: Primary Care

## 2020-03-15 ENCOUNTER — Encounter (INDEPENDENT_AMBULATORY_CARE_PROVIDER_SITE_OTHER): Payer: Self-pay | Admitting: Primary Care

## 2020-03-15 VITALS — BP 129/80 | HR 65 | Temp 98.2°F | Ht 66.0 in | Wt 166.0 lb

## 2020-03-15 DIAGNOSIS — Z7689 Persons encountering health services in other specified circumstances: Secondary | ICD-10-CM

## 2020-03-15 DIAGNOSIS — I824Z1 Acute embolism and thrombosis of unspecified deep veins of right distal lower extremity: Secondary | ICD-10-CM | POA: Diagnosis not present

## 2020-03-15 DIAGNOSIS — Z09 Encounter for follow-up examination after completed treatment for conditions other than malignant neoplasm: Secondary | ICD-10-CM | POA: Diagnosis not present

## 2020-03-15 DIAGNOSIS — I2694 Multiple subsegmental pulmonary emboli without acute cor pulmonale: Secondary | ICD-10-CM

## 2020-03-15 MED ORDER — WARFARIN SODIUM 2 MG PO TABS: 2.0000 mg | ORAL_TABLET | Freq: Every day | ORAL | 3 refills | Status: DC

## 2020-03-15 MED FILL — WARFARIN SODIUM 2 MG TABLET: 2 | 30 days supply | Qty: 30 | Fill #0

## 2020-03-15 NOTE — Progress Notes (Signed)
Ankle swells when he walks

## 2020-03-15 NOTE — Patient Instructions (Signed)
Coagulopata por warfarina Warfarin Coagulopathy La coagulopata por warfarina se refiere al sangrado que puede ocurrir como complicacin por el uso del medicamento warfarina. La warfarina es un diluyente sanguneo (anticoagulante). Los anticoagulantes previenen los cogulos de sangre peligrosos. La complicacin ms comn y ms grave de la warfarina es el sangrado. Durante el tratamiento con warfarina, deber realizarse peridicamente anlisis de sangre (tiempo de protrombina o [place]) para determinar el tiempo de Biomedical scientist. Los [employer] del anlisis del tiempo de protrombina se informarn como el ndice internacional normalizado (IIN). El IIN le informa al mdico si es necesario modificar la dosis de warfarina. Cuanto ms [employer] tome a la Media planner, mayor ser el valor del IIN. El riesgo de coagulopata por warfarina aumenta a medida que aumenta el valor del IIN. Cules son las causas? Esta afeccin puede ser causada por lo siguiente:  El consumo excesivo de warfarina (sobredosis).  Afecciones mdicas subyacentes.  Cambios en la dieta.  Interacciones con medicamentos, suplementos o alcohol. Cules son los signos o los sntomas? La coagulopata por warfarina puede causar sangrado de cualquier tejido u rgano. Entre los sntomas se pueden incluir los siguientes:  Hemorragia en las encas.  Una hemorragia nasal que no se detiene con facilidad.  Sangre en las heces. Las heces pueden verse de color rojo brillante u oscuro, negras o alquitranadas.  Sangre en la orina. La [place] puede verse de color rosa, rojo o [place].  Hematomas sin causa o que aparecen con facilidad.  Tiene un corte que no deja de sangrar luego de 10 minutos.  Tos con sangre.  Vmitos de [place].  Tener nuseas durante ms de 1da.  Rotura de vasos sanguneos en el ojo (hemorragia subconjuntival). Esta puede verse como una mancha de color rojo oscuro o brillante en la parte blanca del ojo.  Dolor abdominal  o de espalda, con o sin hematomas.  Dolor de cabeza repentino e intenso.  Debilidad o adormecimiento sbito de la cara, el brazo o la pierna, especialmente en un lado del cuerpo.  Confusin sbita.  Dificultad para hablar (afasia) o comprender el lenguaje.  Dificultad sbita en la visin en uno o ambos ojos.  Dificultad imprevista para caminar.  Mareos.  Prdida del equilibrio o de la coordinacin.  Hemorragia vaginal inusual.  Hinchazn o dolor en el lugar en que le han colocado una inyeccin.  Cicatrices en la piel debido a la muerte del tejido adiposo (necrosis). Esto puede causar [employer] cintura, los muslos o las [place]. Esto es ms frecuente en las mujeres. Cmo se diagnostica? Esta afeccin la diagnostica el mdico despus de que descubre cmo afecta la warfarina que usted estaba tomando en la coagulacin de la [place]. Se realizan anlisis del tiempo de protrombina (TP) para controlar el factor de coagulacin. Estos anlisis tambin ayudan al mdico a Editor, commissioning la dosis de warfarina ms [place] para su caso. Cmo se trata? Esta afeccin se trata con vitaminaK. La vitaminaK ayuda a la sangre a Biochemist, clinical. Puede recibir vitaminaK cada 12horas o segn sea necesario. Tambin puede recibir plasma donado (transfusiones de plasma [employer]). El plasma es la parte lquida de la sangre y contiene sustancias que ayudan a que la sangre coagule. Siga estas indicaciones en su casa: Medicamentos  Tome la Dispensing optician se lo haya indicado el mdico. De este [place], se asegurar de evitar las hemorragias o los cogulos sanguneos que podran causar lesiones graves, dolor o discapacidad.  [employer] a la misma hora. Si  se olvida de tomar la dosis de warfarina, tmela tan pronto como lo recuerde OfficeMax Incorporatedese da. Si no recuerda tomar la dosis ese da, no tome una dosis de ms al AMR Corporationda siguiente.  Pngase en contacto con el mdico si omite una dosis o si  toma una dosis de ms. No modifique la dosis usted mismo para compensar la dosis que omiti o la que tom de ms.  Hable con el mdico o con el farmacutico antes de empezar a tomar o de suspender cualquier otro medicamento. Muchos medicamentos recetados y de venta libre pueden interferir con la warfarina. Estos incluyen las vitaminas, los suplementos alimenticios, los medicamentos a base de hierbas y los analgsicos de [place] libre. Tal vez haya que ajustarle la dosis de warfarina. Comida y bebida  Es importante mantener una dieta normal y equilibrada mientras se toma warfarina. Evite hacer cambios importantes en su dieta. Si planifica cambiar su dieta, hable con el mdico antes de hacer modificaciones.  El mdico puede recomendarle que trabaje con un especialista en dietas y alimentacin (nutricionista).  La vitaminaK puede reducir la eficacia de la warfarina. Esta vitamina se encuentra en muchos alimentos. Consuma una cantidad constante de alimentos que contienen vitaminaK. Por ejemplo, puede decidir comer 2alimentos que contengan vitaminaK cada da. La dosis de warfarina se establece de acuerdo con la cantidad de OGE EnergyvitaminaK en la sangre.  Consuma una cantidad fija de alimentos que contengan vitaminaK. La vitaminaK reduce la eficacia de la warfarina, por lo que consumir la misma cantidad cada da permite que el mdico establezca la dosis correcta de warfarina. Puede optar por comer 2alimentos que contengan vitaminaK por da. Estudios   Asegrese de Assuranthacerse anlisis del TP al menos una vez cada 4a 6semanas durante todo el tiempo que est tomando warfarina.  Pregntele al mdico cul es su rango ideal del IIN. Asegrese de saber Group 1 Automotivesiempre los valores de su rango ideal. Si los valores del IIN no estn dentro del rango ideal, el mdico puede ajustarle la dosis. Cmo prevenir hemorragias y lesiones  Algunos medicamentos y suplementos comunes de venta libre pueden aumentar el riesgo de  hemorragias mientras est tomando warfarina. Estos incluyen los siguientes: ? Paracetamol. ? Aspirina. ? Antiinflamatorios no esteroides (AINE), como ibuprofeno o naproxeno. ? Vitamina E.  Evite las situaciones que le puedan producir hemorragias. Mientras toma warfarina, puede sangrar con ms facilidad. Para limitar las hemorragias, tome las siguientes medidas: ? Utilice un cepillo de dientes ms blando. ? Utilice un hilo dental encerado, en lugar del que no est recubierto con cera. ? Rasrese con afeitadora elctrica, no con navaja. ? Limite el uso de objetos afilados. ? Evite las H. J. Heinzactividades potencialmente peligrosas, como los deportes de Black Diamondcontacto. Instrucciones generales  Use o lleve una identificacin que indique que toma warfarina.  Asegrese de informar a todos los mdicos, incluido el dentista, que est tomando warfarina.  Si debe hacerse una ciruga, informe al mdico que est tomando warfarina. Es posible que deba dejar de tomar warfarina antes de la [place].  Si tiene planes de Museum/gallery exhibitions officeramamantar o de quedar embarazada mientras est tomando warfarina, hable con el mdico.  Evite el alcohol, el tabaco y las drogas. ? Si el mdico lo aprueba, limite el consumo de alcohol a no ms de 1medida por da si es mujer y no est Orthoptistembarazada, y a 2 medidas si es hombre. Una medida equivale a 12oz (355ml) de cerveza, 5oz (148ml) de vino o 1oz (44ml) de bebidas de alta graduacin alcohlica. ? Si cambia la cantidad  de nicotina, tabaco o alcohol que consume, informe al mdico.  Concurra a todas las visitas de control y hgase todos los anlisis de laboratorio como se lo haya indicado el mdico. Esto es muy importante porque la warfarina es un medicamento que debe controlarse de Loss adjuster, chartered. Comunquese con un mdico si:  Omite una dosis.  Toma una dosis de ms.  Tiene planes de someterse a cualquier tipo de [place] o procedimiento.  No puede tomar el medicamento debido a las nuseas, los  vmitos o la diarrea.  Realiza cualquier cambio importante en la dieta o tiene planes de hacerlo.  Empieza a tomar o suspende cualquier medicamento de venta libre, medicamento recetado o suplemento alimenticio.  Ardelle Anton, tiene planes de quedar embarazada o cree que podra estarlo.  Las menstruaciones son ms abundantes de lo normal, o tiene sangrado vaginal fuera de lo normal.  Tiene hematomas fuera de lo comn.  Pierde el apetito.  Tiene fiebre.  Tiene diarrea durante ms de 24horas. Solicite ayuda de inmediato si:  Le aparecen sntomas de una reaccin alrgica, por ejemplo: ? Hinchazn de los labios, la cara, la lengua, la boca o la garganta. ? Erupcin cutnea. ? Picazn. ? Zonas de piel hinchadas, rojas y que producen picazn (ronchas). ? Problemas para respirar. ? Opresin en el pecho.  Tiene sntomas de un accidente cerebrovascular. BEFAST es una manera fcil de recordar las principales seales de advertencia de un accidente cerebrovascular: ? B - Balance (equilibrio). Los signos son dificultad repentina para caminar o prdida del equilibrio. ? E - Eye (ojos). Los signos son dificultad para ver o un cambio repentino en la visin. ? F - Face (rostro). Los signos son debilidad repentina o entumecimiento del rostro, o el rostro o el prpado se caen hacia un lado. ? A - Arm (brazo). Los signos son debilidad o entumecimiento en un brazo. Esto sucede de repente y generalmente en un lado del cuerpo. ? S - Speech (habla). Los signos son dificultad para hablar, hablar arrastrando las palabras o dificultad para comprender el habla. ? T - Time (tiempo). Es tiempo de Freight forwarder a los servicios de Sports administrator. Escriba la hora en la que comenzaron los sntomas.  Presenta otros signos de accidente cerebrovascular, como los siguientes: ? Dolor de cabeza sbito e intenso que no tiene causa aparente. ? Nuseas o vmitos. ? Convulsiones.  Tiene signos o sntomas de un cogulo de  sangre, por ejemplo, los siguientes: ? Dolor o hinchazn en la pierna o el brazo. ? Piel enrojecida o caliente al tacto en el brazo o la pierna. ? Falta de aire o dificultades respiratorias. ? Journalist, newspaper. ? Grant Ruts sin motivo.  Tiene los siguientes sntomas: ? Una cada o un accidente, especialmente si se golpea la cabeza. ? Presenta sangre en la orina. La orina puede verse rojiza, rosada o de color t. ? [employer]. Las heces pueden ser negras o de color rojo brillante. ? Hemorragia que no se detiene despus de ejercer presin en la zona durante . ? Dolor intenso en las articulaciones o la espalda. ? Dedos de los pies morados o azules. ? Ulceraciones cutneas que no desaparecen.  Vmitos o tos con sangre. La sangre puede ser de color rojo brillante o similar a los granos de caf. Estos sntomas pueden representar un problema grave que constituye Radio broadcast assistant. No espere hasta que los sntomas desaparezcan. Solicite atencin mdica de inmediato. Comunquese con el servicio de emergencias de su localidad (911 en los [place]  [place]). No conduzca por sus propios medios [employer] hospital. Resumen  Se debe controlar rigurosamente a la warfarina con anlisis de [place]. Es muy importante que concurra a todas las visitas del laboratorio y a las visitas de control con el mdico. Asegrese de Artist rango ideal del IIN y su dosis de warfarina.  Vigile la cantidad de vitaminaK que consume a diario. Intente comer la misma cantidad [employer].  Use o lleve una identificacin que indique que toma warfarina.  Tome la warfarina a la misma hora [employer]. Llame al mdico si omite una dosis o si toma una dosis de ms. No modifique usted mismo la dosis de warfarina.  Conozca los signos y los sntomas de un cogulo de [place], una hemorragia y un accidente cerebrovascular. Sepa cundo solicitar ayuda mdica de Associate Professor. Esta informacin no tiene Theme park manager  el consejo del mdico. Asegrese de hacerle al mdico cualquier pregunta que tenga. Document Revised: 03/14/2017 Document Reviewed: 03/14/2017 Elsevier Patient Education  2020 [employer].

## 2020-03-15 NOTE — Progress Notes (Signed)
New Patient Office Visit  Subjective:  Patient ID: Victor Rich, male    DOB: 02-20-1976  Age: 44 y.o. MRN: [id number]  CC:  Chief Complaint  Patient presents with  . New Patient (Initial Visit)    HPI Assessment and Plan:    HPI Victor Rich  Is a 44 y.o.male  Hispanic male who primary language is Spanish but does understand some [place]. Victor Rich - [id number] presents for follow up from the Emergency room presented on  02/29/20,complaint of gradual onset, constant, RLE swelling and pain to calf x 1 week. Pt also complains of dyspnea on exertion patient was discharged from the  on 02/29/20.  Establish care and management of warfarin   Past Medical History:  Diagnosis Date  . COVID-19      No Known Allergies    Current Outpatient Medications on File Prior to Visit  Medication Sig Dispense Refill  . acetaminophen (TYLENOL) 500 MG tablet Take 500 mg by mouth every 6 (six) hours as needed for moderate pain or fever. (Patient not taking: Reported on 03/15/2020)    . aspirin EC 81 MG tablet Take 81 mg by mouth daily as needed for moderate pain. Swallow whole. (Patient not taking: Reported on 03/15/2020)    . Multiple Vitamin (MULTIVITAMIN ADULT) TABS Take 1 tablet by mouth daily. (Patient not taking: Reported on 03/15/2020)     No current facility-administered medications on file prior to visit.    ROS: all negative except above.   Physical Exam: Filed Weights   03/15/20 0851  Weight: 166 lb (75.3 kg)   BP 129/80 (BP Location: Right Arm, Patient Position: Sitting, Cuff Size: Normal)   Pulse 65   Temp 98.2 F (36.8 C) (Oral)   Ht 5\' 6"  (1.676 m)   Wt 166 lb (75.3 kg)   SpO2 97%   BMI 26.79 kg/m  General Appearance: Well nourished, in no apparent distress. Eyes: PERRLA, EOMs, conjunctiva no swelling or erythema Sinuses: No Frontal/maxillary tenderness ENT/Mouth: Ext aud canals clear, TMs without erythema, bulging. No erythema, swelling, or  exudate on post pharynx.  Tonsils not swollen or erythematous. Hearing normal.  Neck: Supple, thyroid normal.  Respiratory: Respiratory effort normal, BS equal bilaterally without rales, rhonchi, wheezing or stridor.  Cardio: RRR with no MRGs. Brisk peripheral pulses without edema.  Abdomen: Soft, + BS.  Non tender, no guarding, rebound, hernias, masses. Lymphatics: Non tender without lymphadenopathy.  Musculoskeletal: Full ROM, 5/5 strength, normal gait.  Skin: Warm, dry without rashes, lesions, ecchymosis.  Neuro: Cranial nerves intact. Normal muscle tone, no cerebellar symptoms. Sensation intact.  Psych: Awake and oriented X 3, normal affect, Insight and Judgment appropriate.   Victor Rich was seen today for new patient (initial visit).  Diagnoses and all orders for this visit:  Acute deep vein thrombosis (DVT) of distal vein of right lower extremity (HCC) Currently on 2mg  of warfarin  Discussed signs and symptoms of a DVT-swelling, warmth, pain, and redness -     INR -     Protime-INR  Multiple subsegmental pulmonary emboli without acute cor pulmonale (HCC) Currently on 2mg  of warfarin  Discussed signs and symptoms of pulmonary embolism-shortness of breath with or without exertion, lightheadedness or syncope and fatigue from lack of oxygen -     INR -     Protime-INR  Encounter to establish care Leonia Reader, NP-C will be your  (PCP) she is mastered prepared . She is skilled to diagnosed and treat illness. Also able  to answer health concern as well as continuing care of varied medical conditions, not limited by cause, organ system, or diagnosis.    Hospital discharge follow-up Emergency room presented on  02/29/20,complaint of gradual onset, constant, RLE swelling and pain to calf x 1 week. Pt also complains of dyspnea on exertion patient was discharged from the  on 02/29/20.  Other orders -     warfarin (COUMADIN) 2 MG tablet; Take 1 tablet (2 mg total) by mouth  daily.    Victor Sessions, NP 9:20 AM    Outpatient Encounter Medications as of 03/15/2020  Medication Sig  . [DISCONTINUED] APIXABAN (ELIQUIS) VTE STARTER PACK (10MG  AND 5MG ) Take as directed on package: start with two-5mg  tablets twice daily for 7 days. On day 8, switch to one-5mg  tablet twice daily.  acetaminophen (TYLENOL) 500 MG tablet Take 500 mg by mouth every 6 (six) hours as needed for moderate pain or fever. (Patient not taking: Reported on 03/15/2020)  . aspirin EC 81 MG tablet Take 81 mg by mouth daily as needed for moderate pain. Swallow whole. (Patient not taking: Reported on 03/15/2020)  . Multiple Vitamin (MULTIVITAMIN ADULT) TABS Take 1 tablet by mouth daily. (Patient not taking: Reported on 03/15/2020)  . warfarin (COUMADIN) 2 MG tablet Take 1 tablet (2 mg total) by mouth daily.  . [DISCONTINUED] guaiFENesin-dextromethorphan (ROBITUSSIN DM) 100-10 MG/5ML syrup Take 10 mLs by mouth every 4 (four) hours as needed for cough.  . [DISCONTINUED] predniSONE (DELTASONE) 10 MG tablet Take 10 mg by mouth daily with breakfast.   . [DISCONTINUED] Pseudoeph-Doxylamine-DM-APAP (NYQUIL PO) Take 30 mLs by mouth daily as needed (cold symptoms).  . [DISCONTINUED] SM TUSSIN DM 100-10 MG/5ML liquid Take 10 mLs by mouth every 4 (four) hours as needed (cough).    No facility-administered encounter medications on file as of 03/15/2020.    Follow-up: No follow-ups on file.   03/17/2020, NP

## 2020-03-16 ENCOUNTER — Ambulatory Visit: Payer: Self-pay | Attending: Primary Care

## 2020-03-16 LAB — PROTIME-INR
INR: 1.8 — ABNORMAL HIGH (ref 0.9–1.2)
Prothrombin Time: 18.5 s — ABNORMAL HIGH (ref 9.1–12.0)

## 2020-03-23 ENCOUNTER — Other Ambulatory Visit: Payer: Self-pay | Admitting: Family Medicine

## 2020-03-23 ENCOUNTER — Other Ambulatory Visit: Payer: Self-pay

## 2020-03-23 ENCOUNTER — Ambulatory Visit: Payer: Medicaid Other | Attending: Primary Care | Admitting: Pharmacist

## 2020-03-23 DIAGNOSIS — I824Z1 Acute embolism and thrombosis of unspecified deep veins of right distal lower extremity: Secondary | ICD-10-CM

## 2020-03-23 DIAGNOSIS — I2694 Multiple subsegmental pulmonary emboli without acute cor pulmonale: Secondary | ICD-10-CM

## 2020-03-23 LAB — POCT INR: INR: 1.8 — AB (ref 2.0–3.0)

## 2020-03-23 MED ORDER — APIXABAN 5 MG PO TABS: 10.0000 mg | ORAL_TABLET | Freq: Two times a day (BID) | ORAL | 0 refills | Status: DC

## 2020-03-23 MED ORDER — APIXABAN 5 MG PO TABS: 5.0000 mg | ORAL_TABLET | Freq: Two times a day (BID) | ORAL | 2 refills | Status: DC

## 2020-03-23 MED FILL — !ELIQUIS 5MG TABLET: 5 | 30 days supply | Qty: 60 | Fill #0

## 2020-03-23 MED FILL — !ELIQUIS 5MG TABLET: 5 | 7 days supply | Qty: 28 | Fill #0

## 2020-04-11 ENCOUNTER — Telehealth (INDEPENDENT_AMBULATORY_CARE_PROVIDER_SITE_OTHER): Payer: Self-pay | Admitting: Primary Care

## 2020-04-11 NOTE — Telephone Encounter (Signed)
Sent to PCP ?

## 2020-04-11 NOTE — Telephone Encounter (Signed)
Patient is calling by way of pacfic interpretors. Patient is requesting a letter for child support that he is unable to work due to Acute deep vein thrombosis (DVT) of distal vein of right lower extremity (HCC) - Primary- and the clot in his legs cause severe pain. Please advise when the letter is ready.pick up Cb- [phone]

## 2020-04-14 ENCOUNTER — Other Ambulatory Visit: Payer: Self-pay

## 2020-04-14 ENCOUNTER — Ambulatory Visit (INDEPENDENT_AMBULATORY_CARE_PROVIDER_SITE_OTHER): Payer: Medicaid Other | Admitting: Primary Care

## 2020-04-14 ENCOUNTER — Encounter (INDEPENDENT_AMBULATORY_CARE_PROVIDER_SITE_OTHER): Payer: Self-pay | Admitting: Primary Care

## 2020-04-14 VITALS — BP 125/82 | HR 67 | Temp 97.5°F | Ht 66.0 in | Wt 174.8 lb

## 2020-04-14 DIAGNOSIS — R600 Localized edema: Secondary | ICD-10-CM

## 2020-04-14 NOTE — Patient Instructions (Signed)
Cmo usar las medias de compresin How to Use Compression Stockings Las medias de compresin son medias elsticas que aprietan las piernas. Ayudan a aumentar el flujo (la circulacin) de sangre a las piernas, reducir la hinchazn y Technical sales engineer la probabilidad de que se formen cogulos de Retail buyer en la parte inferior de las piernas. Generalmente, las siguientes personas usan medias de compresin:  Las que se estn recuperando de [place].  Las que tienen mala circulacin en las piernas.  Las que son propensas a Warehouse manager cogulos de [employer] piernas.  Las que tienen venas abultadas (varicosas).  Las que Teachers Insurance and Annuity Association sentadas o en cama durante mucho tiempo. Siga las instrucciones del mdico acerca de cmo y cundo usar sus medias de compresin. Cmo usar las medias de compresin. Antes de colocarse las medias de compresin:  Asegrese de que las medias sean de la talla y [place] de compresin correctos. Si no sabe el tamao tamao o el grado de compresin necesario, consulte a su mdico y Adult nurse las instrucciones del fabricante que vienen con las medias.  Asegrese de que estn limpias, secas y en buenas condiciones.  Revselas para ver si estn rasgadas o rotas. No se las ponga si estn rasgadas o rotas. Pngase las medias en cuanto se despierte por la maana, antes de levantarse. Tngalas puestas durante el tiempo que el mdico le aconseje. Cuando use las medias:  Mantngalas tan estiradas como sea posible. No permita que se arruguen. Es Acupuncturist que las medias se arruguen alrededor de los dedos de los pies o detrs de las rodillas.  No se baje las medias ni las deje bajas. Esto puede reducir el flujo de sangre a las piernas.  Cmbieselas de inmediato si se mojan o se ensucian. Cuando se quite las medias, [employer] piernas y los pies. Est atento a los siguientes signos:  Advertising copywriter.  Puntos rojos.  Hinchazn. Consejos generales  No deje de [employer] de  compresin sin hablar antes con su mdico.  Lave diariamente las medias con un detergente suave y agua fra o tibia. No use leja. Deje secar las medias al aire o squelas en una secadora de ropa a baja temperatura. Puede ser til [employer] a fin de Warehouse manager un par para usar mientras el otro se lava.  Reemplace las medias cada 3 a .  Si le han indicado que use un humectante para la piel como parte de su plan de tratamiento, aplique la crema o la locin por la noche, de modo que la piel est seca cuando se coloque las medias por la [place]. Es ms difcil ponerse las medias con locin en las piernas o los pies.  Use calzado y calcetines antideslizantes cuando camine y tenga puestas medias de compresin. Comunquese con un mdico y qutese las medias si tiene:  Sensacin de pinchazos y Contractor en los pies o en las piernas.  Llagas abiertas, manchas rojas u otros cambios en la piel de los pies o las piernas.  Enrojecimiento o dolor que empeoran. Solicite ayuda inmediatamente si tiene:  Adormecimiento u hormigueo en la parte inferior de las piernas que no mejora inmediatamente despus de [employer].  Los dedos de los pies o los pies inusualmente fros o que se tornan de un color [place].  Un rea caliente o enrojecida en la pierna.  Hinchazn o dolor nuevos en la pierna.  Falta de aire.  Dolor en el pecho.  Latidos cardacos rpidos o irregulares.  Sensacin de  desvanecimiento.  Mareos. Resumen  Las medias de compresin son medias elsticas que aprietan las piernas.  Ayudan a aumentar el flujo (la circulacin) de sangre a las piernas, reducir la hinchazn y Technical sales engineer la probabilidad de que se formen cogulos de Retail buyer en la parte inferior de las piernas.  Siga las instrucciones del mdico acerca de cmo y cundo usar sus medias de compresin.  No deje de [employer] de compresin sin hablar antes con el mdico. Esta informacin no tiene Theme park manager  el consejo del mdico. Asegrese de hacerle al mdico cualquier pregunta que tenga. Document Revised: 07/20/2017 Document Reviewed: 07/20/2017 Elsevier Patient Education  2020 [employer].

## 2020-04-14 NOTE — Progress Notes (Signed)
Pt states edema has been present for 2 months When he is resting the edema is fine when he works it gets worse

## 2020-04-15 NOTE — Telephone Encounter (Signed)
Seen patient can not write not to be out of work for the month he took off for DVT

## 2020-04-17 NOTE — Progress Notes (Signed)
Established Patient Office Visit  Subjective:  Patient ID: Victor Rich, male    DOB: 1975-10-21  Age: 44 y.o. MRN: [id number]  CC:  Chief Complaint  Patient presents with  . Edema    right leg     HPI Mr. Victor Rich is a 44 year old Hispanic male who presents for pain and swelling in his right leg.  As long as his legs are elevated there is no swelling when he works all day he has noticed swelling in his leg right leg only.  Past Medical History:  Diagnosis Date  . COVID-19     History reviewed. No pertinent surgical history.  Family History  Problem Relation Age of Onset  . Diabetes Mother   . Hypertension Mother   . Diabetes Father   . Hypertension Father     Social History   Socioeconomic History  . Marital status: Married    Spouse name: Not on file  . Number of children: Not on file  . Years of education: Not on file  . Highest education level: Not on file  Occupational History  . Not on file  Tobacco Use  . Smoking status: Never Smoker  . Smokeless tobacco: Never Used  Vaping Use  . Vaping Use: Never used  Substance and Sexual Activity  . Alcohol use: Never  . Drug use: Never  . Sexual activity: Not on file  Other Topics Concern  . Not on file  Social History Narrative   ** Merged History Encounter **       Social Determinants of Health   Financial Resource Strain:   . Difficulty of Paying Living Expenses: Not on file  Food Insecurity:   . Worried About Programme researcher, broadcasting/film/video in the Last Year: Not on file  . Ran Out of Food in the Last Year: Not on file  Transportation Needs:   . Lack of Transportation (Medical): Not on file  . Lack of Transportation (Non-Medical): Not on file  Physical Activity:   . Days of Exercise per Week: Not on file  . Minutes of Exercise per Session: Not on file  Stress:   . Feeling of Stress : Not on file  Social Connections:   . Frequency of Communication with Friends and Family: Not on file  .  Frequency of Social Gatherings with Friends and Family: Not on file  . Attends Religious Services: Not on file  . Active Member of Clubs or Organizations: Not on file  . Attends Banker Meetings: Not on file  . Marital Status: Not on file  Intimate Partner Violence:   . Fear of Current or Ex-Partner: Not on file  . Emotionally Abused: Not on file  . Physically Abused: Not on file  . Sexually Abused: Not on file    Outpatient Medications Prior to Visit  Medication Sig Dispense Refill  . apixaban (ELIQUIS) 5 MG TABS tablet Take 1 tablet (5 mg total) by mouth 2 (two) times daily. Starting: 03/30/2020. 60 tablet 2  . acetaminophen (TYLENOL) 500 MG tablet Take 500 mg by mouth every 6 (six) hours as needed for moderate pain or fever. (Patient not taking: Reported on 03/15/2020)    . Multiple Vitamin (MULTIVITAMIN ADULT) TABS Take 1 tablet by mouth daily. (Patient not taking: Reported on 03/15/2020)    . apixaban (ELIQUIS) 5 MG TABS tablet Take 2 tablets (10 mg total) by mouth 2 (two) times daily. For 1 week. 28 tablet 0   No facility-administered  medications prior to visit.    No Known Allergies  ROS Review of Systems  Cardiovascular: Positive for leg swelling.  All other systems reviewed and are negative.     Objective:    Physical Exam Vitals reviewed.  Constitutional:      Appearance: Normal appearance.  HENT:     Head: Normocephalic.     Right Ear: Tympanic membrane normal.     Left Ear: Tympanic membrane normal.     Nose: Nose normal.  Cardiovascular:     Rate and Rhythm: Normal rate and regular rhythm.  Pulmonary:     Effort: Pulmonary effort is normal.     Breath sounds: Normal breath sounds.  Musculoskeletal:        General: Normal range of motion.     Cervical back: Normal range of motion.  Skin:    General: Skin is warm and dry.  Neurological:     Mental Status: He is alert and oriented to person, place, and time.     BP 125/82 (BP Location:  Right Arm, Patient Position: Sitting, Cuff Size: Normal)   Pulse 67   Temp (!) 97.5 F (36.4 C) (Temporal)   Ht 5\' 6"  (1.676 m)   Wt 174 lb 12.8 oz (79.3 kg)   SpO2 97%   BMI 28.21 kg/m  Wt Readings from Last 3 Encounters:  04/14/20 174 lb 12.8 oz (79.3 kg)  03/15/20 166 lb (75.3 kg)  02/29/20 164 lb (74.4 kg)     There are no preventive care reminders to display for this patient.  There are no preventive care reminders to display for this patient.  No results found for: TSH Lab Results  Component Value Date   WBC 6.2 02/29/2020   HGB 15.3 02/29/2020   HCT 45.5 02/29/2020   MCV 90.6 02/29/2020   PLT 190 02/29/2020   Lab Results  Component Value Date   NA 139 02/29/2020   K 3.9 02/29/2020   CO2 24 02/29/2020   GLUCOSE 105 (H) 02/29/2020   BUN 14 02/29/2020   CREATININE 0.87 02/29/2020   BILITOT 0.8 02/10/2020   ALKPHOS 140 (H) 02/10/2020   AST 29 02/10/2020   ALT 81 (H) 02/10/2020   PROT 5.9 (L) 02/10/2020   ALBUMIN 2.8 (L) 02/10/2020   CALCIUM 8.6 (L) 02/29/2020   ANIONGAP 9 02/29/2020   No results found for: CHOL No results found for: HDL No results found for: [facility] Lab Results  Component Value Date   TRIG 159 (H) 02/03/2020   No results found for: [facility] Lab Results  Component Value Date   HGBA1C 6.1 (H) 02/05/2020      Assessment & Plan:  Victor Rich was seen today for edema.  Diagnoses and all orders for this visit:  Localized edema Explained dependent edema also demonstrated how at this visit his leg has no swelling when at home if he is able to mash his leg with one finger and an indention takes place-swelling/edema.  Understood.  Also suggested he wears compression hose/TED hose for swelling when on feet all day at work.  We discussed signs and symptoms of a DVT he is well aware and for me with him and has a little more comparability with leg swelling     No orders of the defined types were placed in this encounter.   Follow-up: Return  if symptoms worsen or fail to improve.    Leonia Reader, NP

## 2020-04-18 ENCOUNTER — Ambulatory Visit (INDEPENDENT_AMBULATORY_CARE_PROVIDER_SITE_OTHER): Payer: Self-pay | Admitting: Primary Care

## 2020-05-05 ENCOUNTER — Telehealth (INDEPENDENT_AMBULATORY_CARE_PROVIDER_SITE_OTHER): Payer: Self-pay

## 2020-05-05 NOTE — Telephone Encounter (Signed)
Copied from CRM [id number]. Topic: General - Other >> May 04, 2020  1:25 PM Laural Benes, [place] C wrote:  Please advise if patient is to continue Eliquis. Celisse Ciulla Budd Palmer, CMA D  Reason for CRM: pt called in for advise. Pt would like to know if he is suppose to continue taking apixaban (ELIQUIS) 5 MG TABS tablet? Pt says he took his last dose today.     CB: [phone]

## 2020-05-06 NOTE — Telephone Encounter (Signed)
Pt calling again regarding this message. Pt states that his feet are still swollen and he isnt sure what to do. Please advise.

## 2020-05-09 NOTE — Telephone Encounter (Signed)
SENT TO PCP ?

## 2020-05-12 MED FILL — !ELIQUIS 5MG TABLET: 5 | 30 days supply | Qty: 60 | Fill #1

## 2020-06-08 MED FILL — ELIQUIS 5 MG TABLET: 5 | 30 days supply | Qty: 60 | Fill #2

## 2020-06-14 ENCOUNTER — Ambulatory Visit (INDEPENDENT_AMBULATORY_CARE_PROVIDER_SITE_OTHER): Payer: Self-pay | Admitting: Primary Care

## 2020-06-15 MED FILL — ELIQUIS 5 MG TABLET: 5 | 30 days supply | Qty: 60 | Fill #2

## 2021-12-04 ENCOUNTER — Encounter (HOSPITAL_COMMUNITY): Payer: Self-pay

## 2021-12-04 ENCOUNTER — Other Ambulatory Visit: Payer: Self-pay

## 2021-12-04 ENCOUNTER — Emergency Department (HOSPITAL_COMMUNITY)
Admission: EM | Admit: 2021-12-04 | Discharge: 2021-12-04 | Discharge status: Against Medical Advice | Payer: Medicaid Other | Attending: Emergency Medicine | Admitting: Emergency Medicine

## 2021-12-04 ENCOUNTER — Ambulatory Visit (INDEPENDENT_AMBULATORY_CARE_PROVIDER_SITE_OTHER): Payer: Self-pay

## 2021-12-04 ENCOUNTER — Emergency Department (HOSPITAL_COMMUNITY): Payer: Medicaid Other

## 2021-12-04 DIAGNOSIS — Z8616 Personal history of COVID-19: Secondary | ICD-10-CM | POA: Diagnosis not present

## 2021-12-04 DIAGNOSIS — R079 Chest pain, unspecified: Secondary | ICD-10-CM | POA: Insufficient documentation

## 2021-12-04 DIAGNOSIS — R0602 Shortness of breath: Secondary | ICD-10-CM | POA: Insufficient documentation

## 2021-12-04 DIAGNOSIS — Z5321 Procedure and treatment not carried out due to patient leaving prior to being seen by health care provider: Secondary | ICD-10-CM | POA: Insufficient documentation

## 2021-12-04 HISTORY — DX: Other pulmonary embolism without acute cor pulmonale: I26.99

## 2021-12-04 HISTORY — DX: Acute embolism and thrombosis of unspecified deep veins of unspecified lower extremity: I82.409

## 2021-12-04 LAB — BASIC METABOLIC PANEL
Anion gap: 6 (ref 5–15)
BUN: 14 mg/dL (ref 6–20)
CO2: 25 mmol/L (ref 22–32)
Calcium: 8.9 mg/dL (ref 8.9–10.3)
Chloride: 107 mmol/L (ref 98–111)
Creatinine, Ser: 0.84 mg/dL (ref 0.61–1.24)
GFR, Estimated: >60 mL/min (ref >60)
Glucose, Bld: 110 mg/dL — ABNORMAL HIGH (ref 70–99)
Potassium: 3.8 mmol/L (ref 3.5–5.1)
Sodium: 138 mmol/L (ref 135–145)

## 2021-12-04 LAB — CBC
HCT: 43.3 % (ref 39.0–52.0)
Hemoglobin: 14.7 g/dL (ref 13.0–17.0)
MCH: 29.9 pg (ref 26.0–34.0)
MCHC: 33.9 g/dL (ref 30.0–36.0)
MCV: 88 fL (ref 80.0–100.0)
Platelets: 283 10*3/uL (ref 150–400)
RBC: 4.92 MIL/uL (ref 4.22–5.81)
RDW: 13.3 % (ref 11.5–15.5)
WBC: 6.2 10*3/uL (ref 4.0–10.5)
nRBC: 0 % (ref 0.0–0.2)

## 2021-12-04 LAB — D-DIMER, QUANTITATIVE: D-Dimer, Quant: <0.27 ug/mL-FEU (ref 0.00–0.50)

## 2021-12-04 LAB — TROPONIN I (HIGH SENSITIVITY)
Troponin I (High Sensitivity): 2 ng/L (ref <18)
Troponin I (High Sensitivity): <2 ng/L (ref <18)

## 2021-12-04 NOTE — ED Triage Notes (Signed)
Patient reports that he has had intermittent SOB and left chest pain x 1 month. Patient states symptoms are worse at night and more frequent since last week.

## 2021-12-04 NOTE — ED Provider Triage Note (Signed)
Emergency Medicine Provider Triage Evaluation Note  [facility] , a 46 y.o. male  was evaluated in triage.  Pt complains of SOB for the past month with associated chest pain. Patient reports that it has been gradually worsening over the past two weeks. 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 Patient reports it is worse at night.   Review of Systems  Positive:  Negative:   Physical Exam  BP (!) 137/94 (BP Location: Left Arm)   Pulse 62   Temp (!) 97.5 F (36.4 C) (Oral)   Resp 18   Ht 5\' 7"  (1.702 m)   Wt 73.5 kg   SpO2 100%   BMI 25.37 kg/m  Gen:   Awake, no distress   Resp:  Normal effort, diminished  MSK:   Moves extremities without difficulty  Other:  No leg swelling noted.   Medical Decision Making  Medically screening exam initiated at 12:18 PM.  Appropriate orders placed.  Syre was informed that the remainder of the evaluation will be completed by another provider, this initial triage assessment does not replace that evaluation, and the importance of remaining in the ED until their evaluation is complete.  He denies any blood thinner use, given the patient's history- will add on a d-dimer.   [employer], PA-C 12/04/21 1220

## 2021-12-04 NOTE — Telephone Encounter (Signed)
FYI

## 2021-12-04 NOTE — Telephone Encounter (Signed)
Chief Complaint: chet pain and SOB Symptoms: SOB worsening and pain comes and goes taed moderate to severe Frequency: 1 month Pertinent Negatives: Patient denies sweating nausea Disposition: [x] ED /[] Urgent Care (no appt availability in office) / [] Appointment(In office/virtual)/ []  [place] [facility]/ [] Home Care/ [] Refused Recommended Disposition /[] [facility]/ []  Follow-up with PCP Additional Notes: pt's wife to take pt to ED.       Reason for Disposition  Dizziness or lightheadedness  Answer Assessment - Initial Assessment Questions 1. RESPIRATORY STATUS: "Describe your breathing?" (e.g., wheezing, shortness of breath, unable to speak, severe coughing)      SOB,  2. ONSET: "When did this breathing problem begin?"      Worsening this week 3. PATTERN "Does the difficult breathing come and go, or has it been constant since it started?"      Comes and goes 4. SEVERITY: "How bad is your breathing?" (e.g., mild, moderate, severe)    - MILD: No SOB at rest, mild SOB with walking, speaks normally in sentences, can lie down, no retractions, pulse < 100.    - MODERATE: SOB at rest, SOB with minimal exertion and prefers to sit, cannot lie down flat, speaks in phrases, mild retractions, audible wheezing, pulse 100-120.    - SEVERE: Very SOB at rest, speaks in single words, struggling to breathe, sitting hunched forward, retractions, pulse > 120      moderate 5. RECURRENT SYMPTOM: "Have you had difficulty breathing before?" If Yes, ask: "When was the last time?" and "What happened that time?"      Yes since Covid 6. CARDIAC HISTORY: "Do you have any history of heart disease?" (e.g., heart attack, angina, bypass surgery, angioplasty)      no 7. LUNG HISTORY: "Do you have any history of lung disease?"  (e.g., pulmonary embolus, asthma, emphysema)     no 8. CAUSE: "What do you think is causing the breathing problem?"      unsure 9. OTHER SYMPTOMS: "Do you have any other  symptoms? (e.g., dizziness, runny nose, cough, chest pain, fever)    Chest pressure  and  10. O2 SATURATION MONITOR:  "Do you use an oxygen saturation monitor (pulse oximeter) at home?" If Yes, "What is your reading (oxygen level) today?" "What is your usual oxygen saturation reading?" (e.g., 95%)       *No Answer* 11. PREGNANCY: "Is there any chance you are pregnant?" "When was your last menstrual period?"       *No Answer* 12. TRAVEL: "Have you traveled out of the country in the last month?" (e.g., travel history, exposures)       *No Answer*  Answer Assessment - Initial Assessment Questions 1. LOCATION: "Where does it hurt?"       Left side of chest  2. RADIATION: "Does the pain go anywhere else?" (e.g., into neck, jaw, arms, back)     neck 3. ONSET: "When did the chest pain begin?" (Minutes, hours or days)      month 4. PATTERN "Does the pain come and go, or has it been constant since it started?"  "Does it get worse with exertion?"      Comes and goes 5. DURATION: "How long does it last" (e.g., seconds, minutes, hours)     *No Answer* 6. SEVERITY: "How bad is the pain?"  (e.g., Scale 1-10; mild, moderate, or severe)    - MILD (1-3): doesn't interfere with normal activities     - MODERATE (4-7): interferes with normal activities or  awakens from sleep    - SEVERE (8-10): excruciating pain, unable to do any normal activities       Moderate- pressure 7. CARDIAC RISK FACTORS: "Do you have any history of heart problems or risk factors for heart disease?" (e.g., angina, prior heart attack; diabetes, high blood pressure, high cholesterol, smoker, or strong family history of heart disease)     *No Answer* 8. PULMONARY RISK FACTORS: "Do you have any history of lung disease?"  (e.g., blood clots in lung, asthma, emphysema, birth control pills)     *No Answer* 9. CAUSE: "What do you think is causing the chest pain?"     unsure 10. OTHER SYMPTOMS: "Do you have any other symptoms?" (e.g.,  dizziness, nausea, vomiting, sweating, fever, difficulty breathing, cough)       Difficulty breathing, dizziness 11. PREGNANCY: "Is there any chance you are pregnant?" "When was your last menstrual period?"       N/a  Protocols used: Breathing Difficulty-A-AH, Chest Pain-A-AH

## 2022-01-07 IMAGING — CR DG CHEST 2V
2 series · 2 of 2 positions shown · non-contrast
Comparison: None.

CLINICAL DATA: Shortness of breath, recent J1L3Q-XT hospitalization

EXAM:
CHEST - 2 VIEW

[w chest pa]
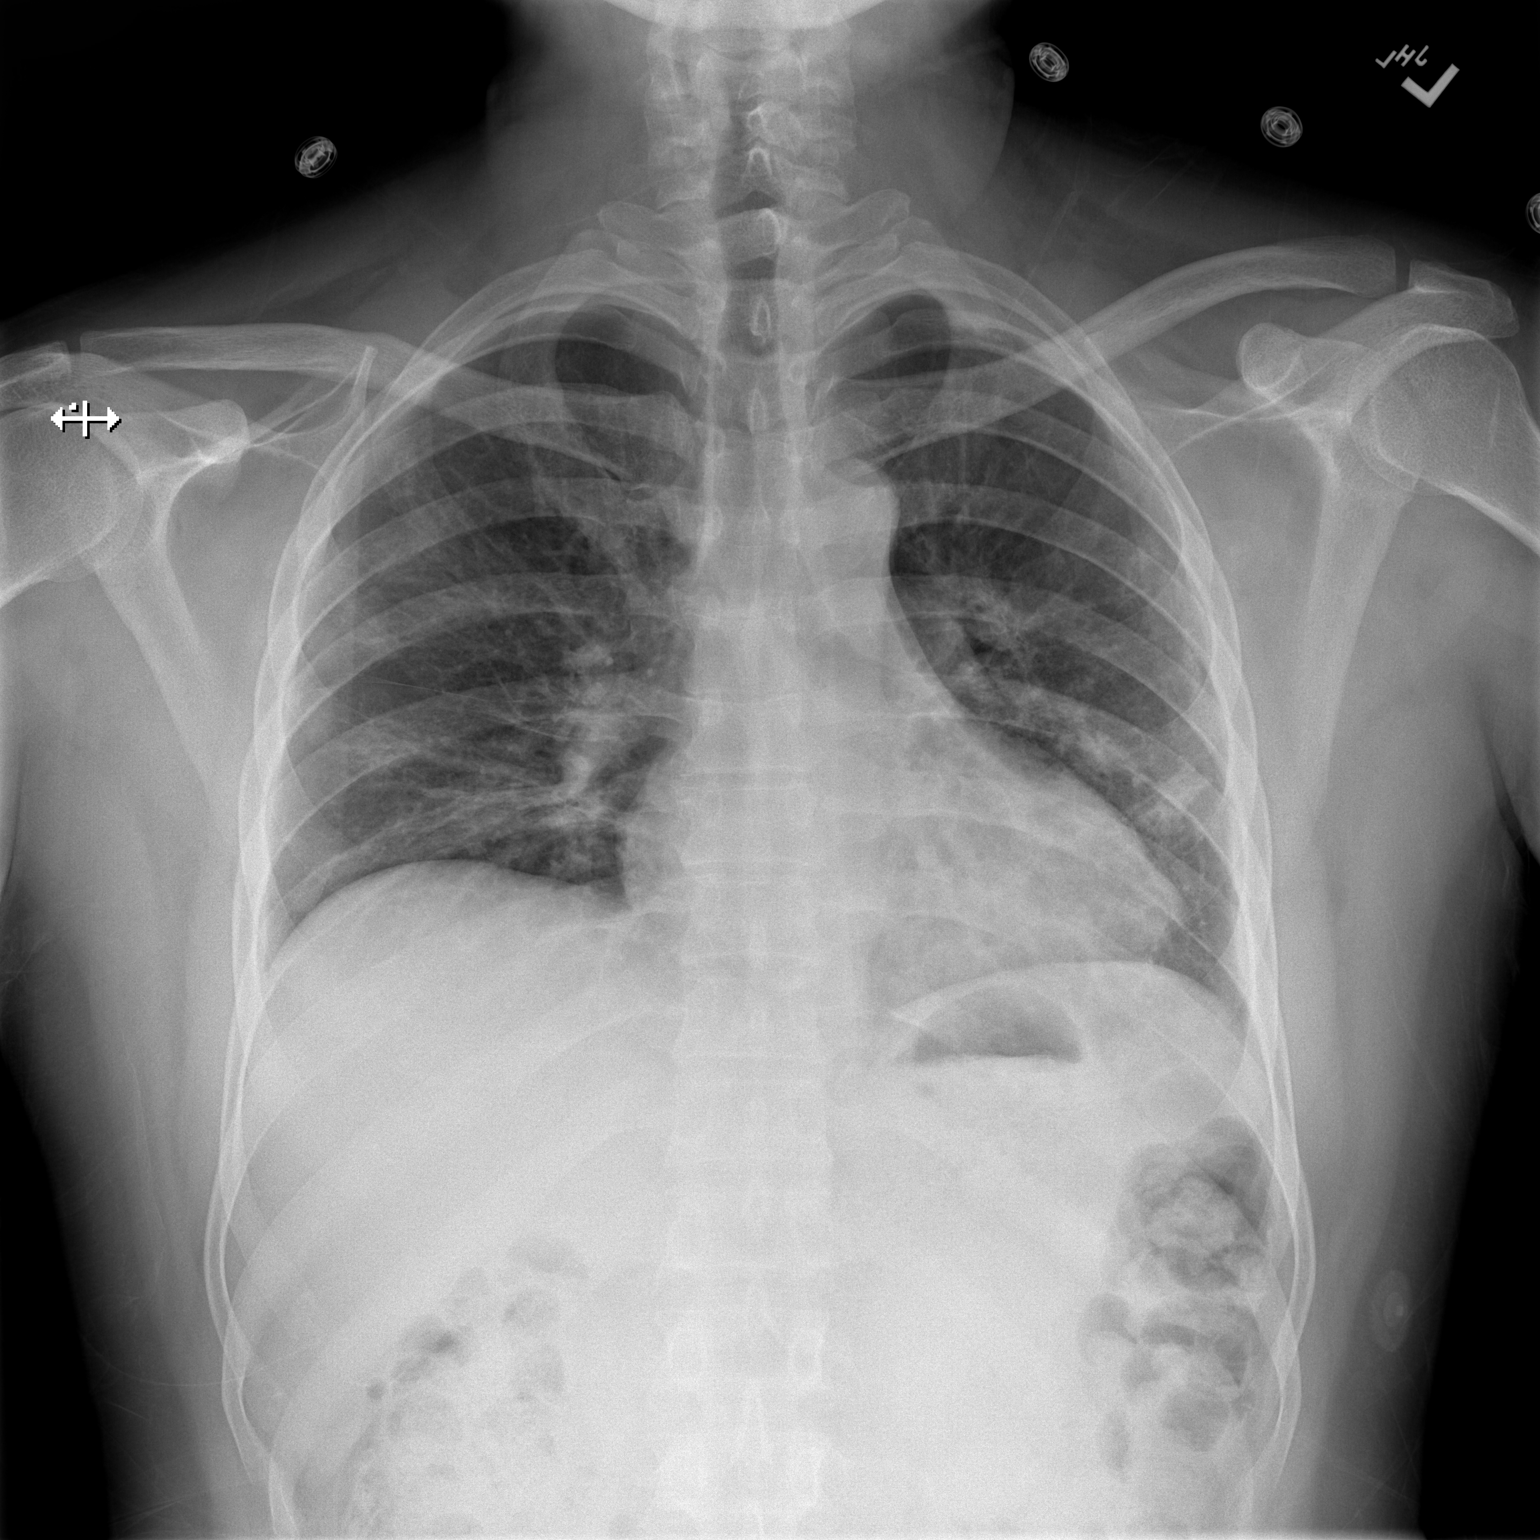

[w chest lat]
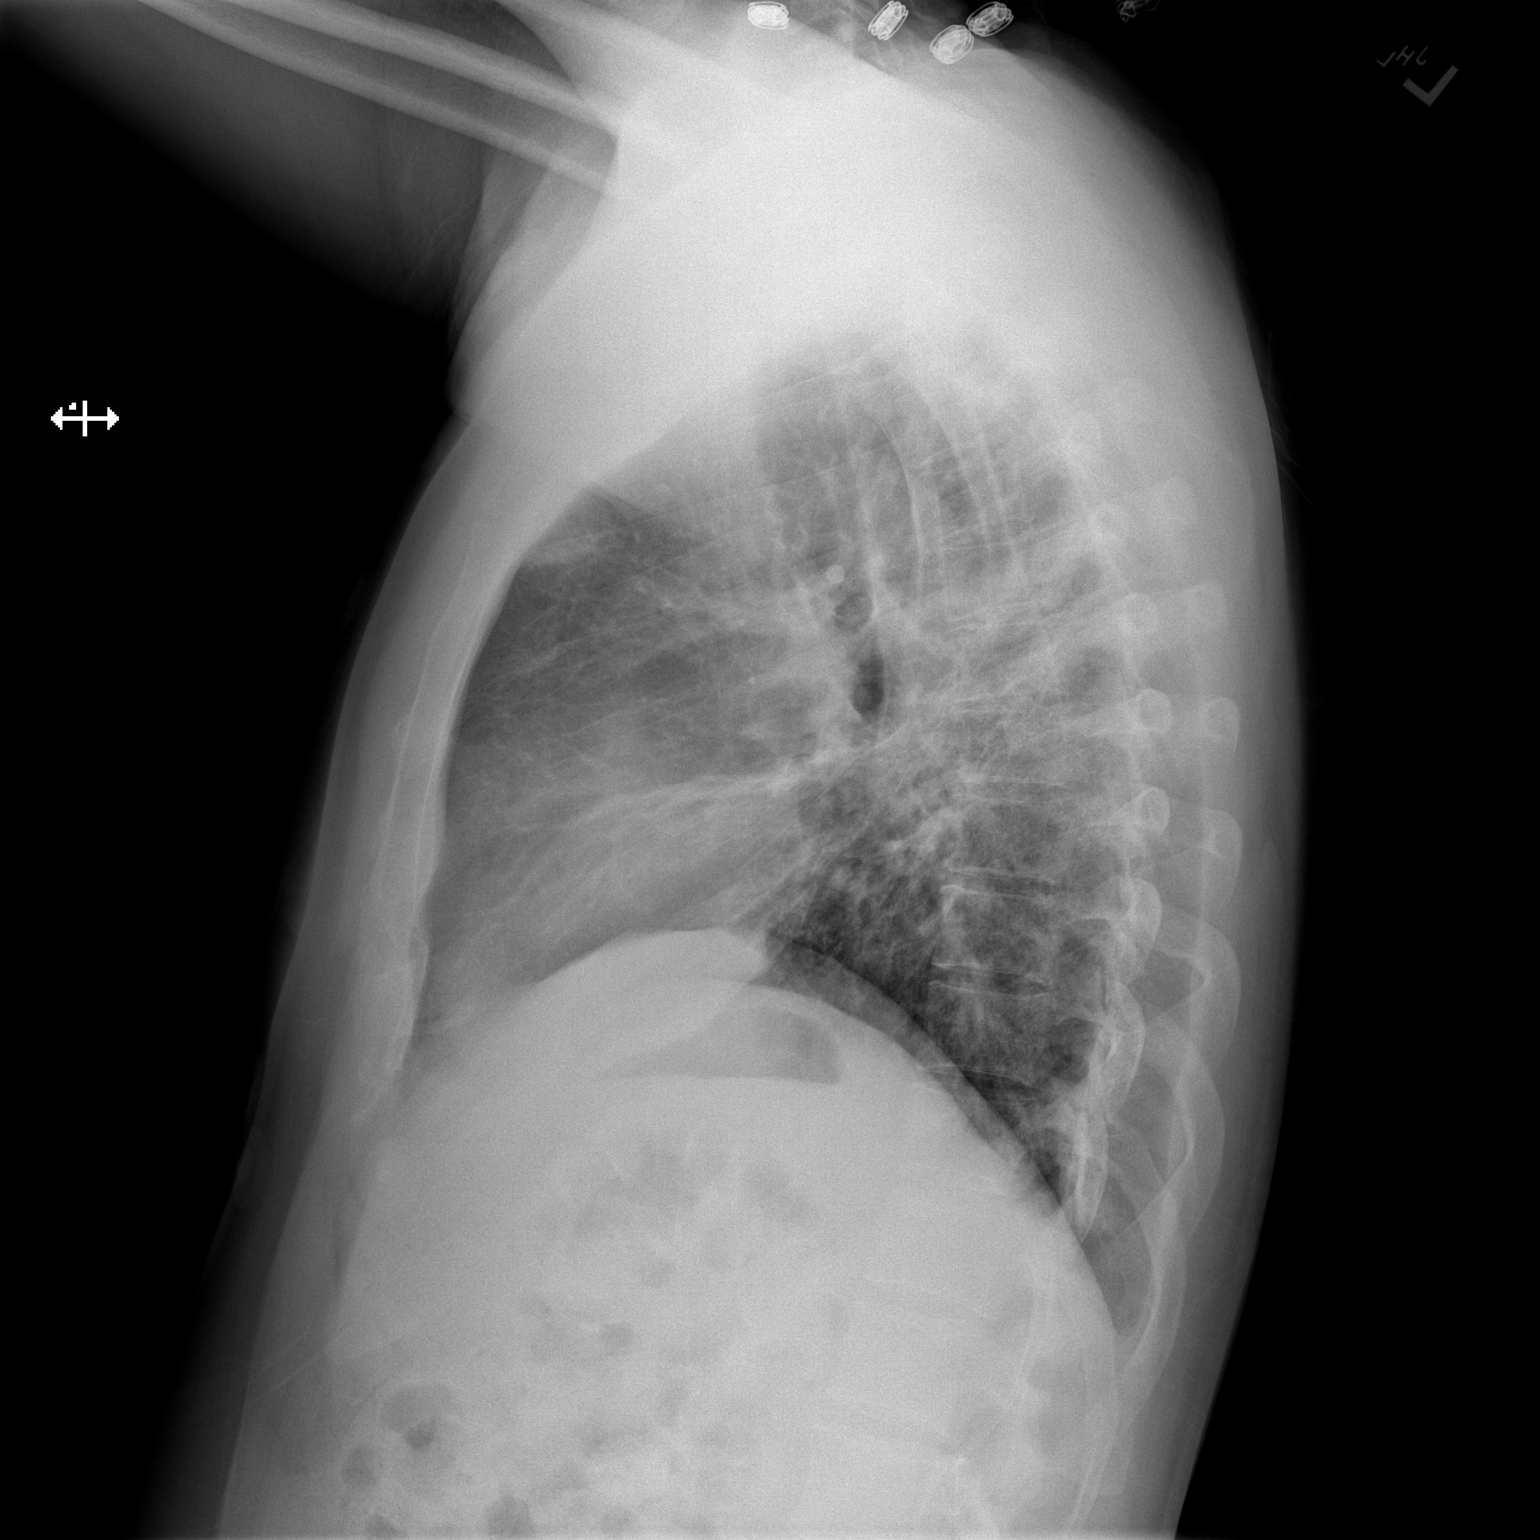

[2 of 2 positions shown; findings below may reference images not displayed]

FINDINGS: Multifocal heterogeneous interstitial airspace opacities in both
lungs. No pneumothorax. No effusion. The cardiomediastinal contours
are unremarkable. No acute osseous or soft tissue abnormality.
IMPRESSION: Multifocal heterogeneous interstitial airspace opacities in both
lungs, compatible with multifocal infection/inflammation in the
setting of J1L3Q-XT.
